# Patient Record
Sex: Male | Born: 1990 | Race: Black or African American | Hispanic: No | Marital: Single | Smoking: Never smoker
Health system: Southern US, Community
[De-identification: ages and names within clinical notes are randomized; demographics above are authoritative.]

## PROBLEM LIST (undated history)

## (undated) DIAGNOSIS — Z789 Other specified health status: Secondary | ICD-10-CM

## (undated) HISTORY — PX: OTHER SURGICAL HISTORY: SHX169

---
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
## 2009-06-14 ENCOUNTER — Emergency Department: Payer: Self-pay | Admitting: Emergency Medicine

## 2013-06-24 ENCOUNTER — Emergency Department: Payer: Self-pay | Admitting: Emergency Medicine

## 2013-06-25 LAB — COMPREHENSIVE METABOLIC PANEL
Albumin: 3.9 g/dL (ref 3.4–5.0)
Anion Gap: 8 (ref 7–16)
BUN: 18 mg/dL (ref 7–18)
Bilirubin,Total: 0.7 mg/dL (ref 0.2–1.0)
Co2: 25 mmol/L (ref 21–32)
Creatinine: 1.62 mg/dL — ABNORMAL HIGH (ref 0.60–1.30)
EGFR (African American): >60
EGFR (Non-African Amer.): 59 — ABNORMAL LOW
Glucose: 94 mg/dL (ref 65–99)
Osmolality: 274 (ref 275–301)
Potassium: 3.7 mmol/L (ref 3.5–5.1)
SGOT(AST): 116 U/L — ABNORMAL HIGH (ref 15–37)
SGPT (ALT): 35 U/L (ref 12–78)
Sodium: 136 mmol/L (ref 136–145)
Total Protein: 7.3 g/dL (ref 6.4–8.2)

## 2013-06-25 LAB — CBC
HGB: 14.4 g/dL (ref 13.0–18.0)
Platelet: 101 10*3/uL — ABNORMAL LOW (ref 150–440)
RBC: 5.2 10*6/uL (ref 4.40–5.90)
WBC: 8.2 10*3/uL (ref 3.8–10.6)

## 2013-06-25 LAB — MONONUCLEOSIS SCREEN: Mono Test: NEGATIVE

## 2013-06-27 LAB — BETA STREP CULTURE(ARMC)

## 2013-08-09 ENCOUNTER — Emergency Department: Payer: Self-pay | Admitting: Emergency Medicine

## 2013-08-11 ENCOUNTER — Observation Stay: Payer: Self-pay | Admitting: Otolaryngology

## 2013-08-11 LAB — COMPREHENSIVE METABOLIC PANEL
Albumin: 3.8 g/dL (ref 3.4–5.0)
Alkaline Phosphatase: 66 U/L (ref 50–136)
Anion Gap: 5 — ABNORMAL LOW (ref 7–16)
BUN: 13 mg/dL (ref 7–18)
Bilirubin,Total: 0.5 mg/dL (ref 0.2–1.0)
Calcium, Total: 9.8 mg/dL (ref 8.5–10.1)
Chloride: 99 mmol/L (ref 98–107)
Co2: 30 mmol/L (ref 21–32)
Creatinine: 1.29 mg/dL (ref 0.60–1.30)
EGFR (Non-African Amer.): >60
Osmolality: 268 (ref 275–301)
Sodium: 134 mmol/L — ABNORMAL LOW (ref 136–145)
Total Protein: 9 g/dL — ABNORMAL HIGH (ref 6.4–8.2)

## 2013-08-11 LAB — BETA STREP CULTURE(ARMC)

## 2013-08-11 LAB — CBC
MCH: 27.1 pg (ref 26.0–34.0)
MCHC: 33 g/dL (ref 32.0–36.0)
Platelet: 160 10*3/uL (ref 150–440)
WBC: 8 10*3/uL (ref 3.8–10.6)

## 2013-08-16 LAB — CULTURE, BLOOD (SINGLE)

## 2014-01-01 ENCOUNTER — Inpatient Hospital Stay: Payer: Self-pay | Admitting: Internal Medicine

## 2014-01-01 LAB — CBC
HCT: 47.9 % (ref 40.0–52.0)
HGB: 15.5 g/dL (ref 13.0–18.0)
MCH: 27.2 pg (ref 26.0–34.0)
MCHC: 32.5 g/dL (ref 32.0–36.0)
MCV: 84 fL (ref 80–100)
PLATELETS: 111 10*3/uL — AB (ref 150–440)
RBC: 5.71 10*6/uL (ref 4.40–5.90)
RDW: 13.7 % (ref 11.5–14.5)
WBC: 5.9 10*3/uL (ref 3.8–10.6)

## 2014-01-01 LAB — COMPREHENSIVE METABOLIC PANEL
ALBUMIN: 4.2 g/dL (ref 3.4–5.0)
ANION GAP: 6 — AB (ref 7–16)
Alkaline Phosphatase: 59 U/L
BUN: 22 mg/dL — AB (ref 7–18)
Bilirubin,Total: 0.5 mg/dL (ref 0.2–1.0)
CO2: 25 mmol/L (ref 21–32)
Calcium, Total: 9.2 mg/dL (ref 8.5–10.1)
Chloride: 103 mmol/L (ref 98–107)
Creatinine: 2.32 mg/dL — ABNORMAL HIGH (ref 0.60–1.30)
EGFR (African American): 44 — ABNORMAL LOW
GFR CALC NON AF AMER: 38 — AB
Glucose: 104 mg/dL — ABNORMAL HIGH (ref 65–99)
OSMOLALITY: 272 (ref 275–301)
Potassium: 3.7 mmol/L (ref 3.5–5.1)
SGOT(AST): 34 U/L (ref 15–37)
SGPT (ALT): 40 U/L (ref 12–78)
SODIUM: 134 mmol/L — AB (ref 136–145)
Total Protein: 8.7 g/dL — ABNORMAL HIGH (ref 6.4–8.2)

## 2014-01-01 LAB — URINALYSIS, COMPLETE
BACTERIA: NONE SEEN
Bilirubin,UR: NEGATIVE
Blood: NEGATIVE
Glucose,UR: NEGATIVE mg/dL (ref 0–75)
Hyaline Cast: 45
LEUKOCYTE ESTERASE: NEGATIVE
Nitrite: NEGATIVE
Ph: 5 (ref 4.5–8.0)
Protein: 100
RBC,UR: 1 /HPF (ref 0–5)
SPECIFIC GRAVITY: 1.034 (ref 1.003–1.030)
Squamous Epithelial: 1

## 2014-01-01 LAB — LIPASE, BLOOD: Lipase: 112 U/L (ref 73–393)

## 2014-01-02 LAB — COMPREHENSIVE METABOLIC PANEL
ALBUMIN: 3.3 g/dL — AB (ref 3.4–5.0)
ALT: 27 U/L (ref 12–78)
ANION GAP: 6 — AB (ref 7–16)
Alkaline Phosphatase: 42 U/L — ABNORMAL LOW
BUN: 22 mg/dL — ABNORMAL HIGH (ref 7–18)
Bilirubin,Total: 0.4 mg/dL (ref 0.2–1.0)
CALCIUM: 7.7 mg/dL — AB (ref 8.5–10.1)
CREATININE: 1.92 mg/dL — AB (ref 0.60–1.30)
Chloride: 107 mmol/L (ref 98–107)
Co2: 24 mmol/L (ref 21–32)
EGFR (Non-African Amer.): 48 — ABNORMAL LOW
GFR CALC AF AMER: 56 — AB
Glucose: 85 mg/dL (ref 65–99)
OSMOLALITY: 276 (ref 275–301)
POTASSIUM: 3.5 mmol/L (ref 3.5–5.1)
SGOT(AST): 22 U/L (ref 15–37)
SODIUM: 137 mmol/L (ref 136–145)
TOTAL PROTEIN: 7 g/dL (ref 6.4–8.2)

## 2014-01-02 LAB — CBC WITH DIFFERENTIAL/PLATELET
BANDS NEUTROPHIL: 19 %
Basophil: 1 %
Comment - H1-Com1: NORMAL
HCT: 40.7 % (ref 40.0–52.0)
HGB: 13.1 g/dL (ref 13.0–18.0)
Lymphocytes: 36 %
MCH: 27.1 pg (ref 26.0–34.0)
MCHC: 32.3 g/dL (ref 32.0–36.0)
MCV: 84 fL (ref 80–100)
MONOS PCT: 16 %
Metamyelocyte: 1 %
Platelet: 91 10*3/uL — ABNORMAL LOW (ref 150–440)
RBC: 4.86 10*6/uL (ref 4.40–5.90)
RDW: 13.5 % (ref 11.5–14.5)
Segmented Neutrophils: 27 %
WBC: 3.2 10*3/uL — AB (ref 3.8–10.6)

## 2014-01-02 LAB — MAGNESIUM: Magnesium: 1.1 mg/dL — ABNORMAL LOW

## 2014-01-02 LAB — WBCS, STOOL

## 2014-01-02 LAB — CLOSTRIDIUM DIFFICILE(ARMC)

## 2014-01-03 LAB — BASIC METABOLIC PANEL
Anion Gap: 8 (ref 7–16)
BUN: 9 mg/dL (ref 7–18)
CO2: 23 mmol/L (ref 21–32)
CREATININE: 1.33 mg/dL — AB (ref 0.60–1.30)
Calcium, Total: 8 mg/dL — ABNORMAL LOW (ref 8.5–10.1)
Chloride: 106 mmol/L (ref 98–107)
EGFR (African American): >60
EGFR (Non-African Amer.): >60
Glucose: 84 mg/dL (ref 65–99)
Osmolality: 272 (ref 275–301)
Potassium: 3.4 mmol/L — ABNORMAL LOW (ref 3.5–5.1)
Sodium: 137 mmol/L (ref 136–145)

## 2014-01-03 LAB — STOOL CULTURE

## 2014-01-03 LAB — MAGNESIUM: MAGNESIUM: 1.9 mg/dL

## 2014-01-04 LAB — BASIC METABOLIC PANEL
ANION GAP: 7 (ref 7–16)
BUN: 7 mg/dL (ref 7–18)
CHLORIDE: 108 mmol/L — AB (ref 98–107)
Calcium, Total: 7.9 mg/dL — ABNORMAL LOW (ref 8.5–10.1)
Co2: 25 mmol/L (ref 21–32)
Creatinine: 1.15 mg/dL (ref 0.60–1.30)
EGFR (African American): >60
EGFR (Non-African Amer.): >60
GLUCOSE: 81 mg/dL (ref 65–99)
Osmolality: 276 (ref 275–301)
Potassium: 3.5 mmol/L (ref 3.5–5.1)
SODIUM: 140 mmol/L (ref 136–145)

## 2014-01-04 LAB — WBC: WBC: 3.1 10*3/uL — ABNORMAL LOW (ref 3.8–10.6)

## 2015-02-07 NOTE — Consult Note (Signed)
PATIENT NAME:  [name], [name]  DATE OF CONSULTATION:  08/11/2013  REFERRING PHYSICIAN:  Dr. Dolores FrameSung CONSULTING PHYSICIAN:  Kyung Ruddreighton C. Ariyonna Twichell, MD  CONSULTING REASON:  Pharyngeal abscess.   HISTORY OF PRESENT ILLNESS:  The patient is a 24 year old male who presents today to [facility] ER for evaluation of worsening sore throat.  He was seen yesterday for the same problem, diagnosed with a viral pharyngitis and was given prednisone and sent home, presents today for a repeat evaluation with worsening symptoms that have not improved with steroids.  He has been having some problems, difficulty with tolerating his own secretions and drooling, was given Cleocin 600 mg and Decadron 10 mg IV here in the Emergency Room with very minimal improvement.  CT scan was performed which revealed a complex fluid collection in his left tonsil as well as some edema of his nasopharynx, pharynx down to the level of his larynx.  The patient continues to report significant pain and difficulty with phonation.  He is able to breathe well.  He has never had anything like this happening to him before.   PAST MEDICAL HISTORY:  Negative.   PAST SURGICAL HISTORY:  No prior surgical procedures on the head and neck.   SOCIAL HISTORY:  [place]  He denies any excessive alcohol or tobacco use.   ALLERGIES:  PENICILLIN AND AMOXICILLIN.   CURRENT MEDICATIONS:  Prednisone.   FAMILY HISTORY:  He has no history of recurrent tonsillitis or easy bleeding.   PHYSICAL EXAMINATION: VITAL SIGNS:  Temperature is 100, pulse is 98, respirations 18, blood pressure 164/89, pulse ox is 98% on room air.    LABORATORY DATA:  White count is 8.0.  CT scan is reviewed which reveals a soft tissue edema of the left pharynx, nasopharynx, tonsil, uvula is a phlegmon of the left tonsil, peritonsillar region and some narrowing of the nasopharynx.  Distal airway is widely patent.    PROCEDURE:  Transnasal flexible laryngoscopy.   PREPROCEDURE DIAGNOSIS:  Possible peritonsillar abscess with edema of the oropharynx.  POSTPROCEDURE DIAGNOSIS:  Possible peritonsillar abscess with edema of the oropharynx.  DESCRIPTION OF PROCEDURE:  Verbal consent was obtained.  The patient's nasal cavity was anesthetized with Genasal and lidocaine and transnasal flexible laryngoscopy was performed.  This demonstrates soft tissue edema and swelling of the patient's left pharynx and nasopharynx and some mild swelling of the hypopharynx and base of tongue, but distal airway vocal folds, epiglottis, supraglottis was widely patent with no significant airway edema.   IMPRESSION:  Peritonsillar phlegmon with difficulty tolerating secretions.   PLAN:  Admit for IV clindamycin as well as IV fluids and continue Decadron and pain control and then we will re-evaluate tomorrow morning.     ____________________________ Kyung Ruddreighton C. Oneka Parada, MD ccv:ea D: 08/11/2013 01:44:37 ET T: 08/11/2013 02:22:04 ET JOB#: [id number]  cc: Kyung Ruddreighton C. Synthia Fairbank, MD, <Dictator> Kyung RuddREIGHTON C Daray Polgar MD ELECTRONICALLY SIGNED 08/31/2013 7:17

## 2015-02-08 NOTE — H&P (Signed)
PATIENT NAME:  [name], [name] MR#:  [id number] DATE OF BIRTH:  09-20-1991  DATE OF ADMISSION:  01/01/2014  ADMITTING PHYSICIAN: Gladstone Lighter, MD  PRIMARY CARE PHYSICIAN: None.   CHIEF COMPLAINT: Nausea, vomiting and diarrhea.   HISTORY OF PRESENT ILLNESS: [name] is a 24 year old African American male with no significant past medical history who presents to the hospital secondary to nausea, vomiting and diarrhea for 4 days now. The patient states he has a 50-year-old son who was sick with diarrhea, nausea and vomiting 1 week ago. The patient started to feel bad for the last 4 days. It started with watery diarrhea. Whatever he was eating he was having a bowel movement immediately after that. He denied any fevers, initially. No abdominal pain. Since last night he has been feeling hot and felt like he had a fever, very sick with body aches and this morning he woke up with intense nausea and vomiting. He has not been eating or drinking well over the last 4 days and presented to the ER. He seems extremely dehydrated on exam and his renal function worsened to 2.3 of creatinine whereas his baseline is almost 1.2. So he is being admitted for systemic antiinflammatory response syndrome with high fever of 103 degrees Fahrenheit, tachycardia, and likely from viral gastroenteritis.   PAST MEDICAL HISTORY: None.   PAST SURGICAL HISTORY: None.   ALLERGIES TO MEDICATIONS: PENICILLIN.   CURRENT HOME MEDICATIONS: None.   SOCIAL HISTORY: Living at home with his mother. He also has a 46-year-old son. He is currently unemployed. He smokes a cigar once in a while and denies any alcohol use.   FAMILY HISTORY: Mom with diabetes. Does not know about his dad.  REVIEW OF SYSTEMS:  CONSTITUTIONAL: Positive for fatigue, fever and weakness.  EYES: No blurred vision, double vision, inflammation or glaucoma.  ENT: No tinnitus, ear pain, hearing loss, epistaxis or discharge.  RESPIRATORY: No cough, wheeze, hemoptysis  or COPD. CARDIOVASCULAR: No chest pain, orthopnea, edema, arrhythmia, palpitations, or syncope.  GASTROINTESTINAL: Positive for nausea, vomiting, and diarrhea. No abdominal pain. No hematemesis or melena.  GENITOURINARY: Decreased urination but no dysuria, hematuria, renal calculus or frequency.  ENDOCRINE: No polyuria, nocturia, thyroid problems, heat or cold intolerance.  HEMATOLOGY: No anemia, easy bruising or bleeding.  SKIN: No acne, rash or lesions.  MUSCULOSKELETAL: No neck, back, shoulder pain, arthritis or gout.  NEUROLOGIC: No numbness, weakness, CVA, TIA or seizures.  PSYCHOLOGICAL: No anxiety, insomnia or depression.   PHYSICAL EXAMINATION: VITAL SIGNS: Temperature 103.1 degrees Fahrenheit, pulse 116, respirations 20, blood pressure 116/54, pulse ox 98% on room air.  GENERAL: Heavily built, well-nourished male lying in bed, not in any acute distress.  HEENT: Normocephalic, atraumatic. Pupils equal, round, and reacting to light. Anicteric sclerae. Extraocular movements intact. Oropharynx clear without erythema, mass or exudates.  NECK: Supple. No thyromegaly, JVD or carotid bruits. No lymphadenopathy.  LUNGS: Moving air bilaterally. No wheeze or crackles. No use of accessory muscles for breathing.  HEART: S1 and S2 regular rate and rhythm. No murmurs, rubs, or gallops.  ABDOMEN: Soft, nontender, nondistended. No hepatosplenomegaly. Normal bowel sounds.  EXTREMITIES: No pedal edema. No clubbing or cyanosis. 2+ dorsalis pedis pulses palpable bilaterally.  SKIN: No acne, rash or lesions. LYMPH: No cervical or inguinal lymphadenopathy.  NEUROLOGIC: Cranial nerves intact. No focal motor or sensory deficits.  PSYCHOLOGIC: The patient is awake, alert, and oriented x3.   DIAGNOSTIC DATA: Laboratory: WBC 5.9, hemoglobin 15.5, hematocrit 47.9, platelet count 111.  Sodium 134, potassium 3.7, chloride 103, bicarb 25, BUN 22, creatinine 2.32, glucose 104 and calcium 9.2.   ALT 40, AST 34,  alk phos 59, total bili 0.5, albumin 4.2. Lipase is 112.   Chest x-ray is showing no acute intracranial abnormalities.   ASSESSMENT AND PLAN: This is a young 24 year old male with no significant past medical history who comes with nausea, vomiting and diarrhea. Son was sick with similar symptoms last week.  1.  Systemic antiinflammatory response syndrome likely secondary to viral gastroenteritis at this point. Will admit for IV fluids. Stool studies and antiemetics have been ordered. Will hold off on antibiotics at this time as it seems more viral in nature. 2.  Acute renal failure, likely prerenal from dehydration. Continue IV fluids and monitor. If no improvement, renal ultrasound will be ordered tomorrow.   CODE STATUS: FULL.  TIME SPENT ON ADMISSION: 50 minutes.   ____________________________ Gladstone Lighter, MD rk:sb D: 01/01/2014 14:19:08 ET T: 01/01/2014 14:50:14 ET JOB#: [id number]  cc: Gladstone Lighter, MD, <Dictator> Gladstone Lighter MD ELECTRONICALLY SIGNED 01/10/2014 16:04

## 2015-02-08 NOTE — Discharge Summary (Signed)
PATIENT NAME:  [name], [name] MR#:  [id number] DATE OF BIRTH:  [birth year]  DATE OF ADMISSION:  01/01/2014 DATE OF DISCHARGE:  01/04/2014  ADMITTING PHYSICIAN: Enid Baasadhika Seymore Brodowski, MD  DISCHARGING PHYSICIAN: Enid Baasadhika Magon Croson, MD  PRIMARY CARE PHYSICIAN: None.   CONSULTATIONS IN HOSPITAL: None.   DISCHARGE DIAGNOSES: 1.  Systemic inflammatory response syndrome.  2.  Clostridium difficile colitis.  3.  Acute renal failure, which resolved. 4.  Hypomagnesemia.   DISCHARGE HOME MEDICATIONS:  1.  Flagyl 500 mg p.o. t.i.d. for 10 days.  2.  Bentyl 10 mg q.8 hours p.r.n. for abdominal cramping.   DISCHARGE DIET: Regular diet.   DISCHARGE ACTIVITY: As tolerated.    FOLLOWUP INSTRUCTIONS: If symptoms worsen, come back to urgent care, as patient has no PCP.   LABORATORY AND IMAGING STUDIES PRIOR TO DISCHARGE: Sodium 140, potassium 3.5, chloride 108, bicarbonate 25, BUN 7, creatinine 1.15, glucose 81, and calcium of 7.9. WBC 3.1; hemoglobin is 13.1, hematocrit 40.7, platelet count 91. Magnesium is 1.9. ALT is 27, AST 22, alkaline phosphatase 42, total bilirubin 0.4, and albumin of 3.3. Stool cultures are negative and WBCs are negative in stool, but stool for C. difficile antigen and toxin are positive. Chest x-ray on admission showing clear lung fields. No acute cardiopulmonary abnormality. Urinalysis negative for any infection.   BRIEF HOSPITAL COURSE: [name] is a young 24 year old African American male with no significant past medical history, presents with nausea, vomiting, and diarrhea after being exposed to his son who has had similar symptoms a week ago. The patient also had significant high-grade fevers and leukocytosis on admission.  1.  Systemic inflammatory response syndrome secondary to C. difficile colitis, initially thought to be viral. However, stool for C. difficile was sent and it came back positive. The patient has been started on Flagyl, and for the first couple of days his  symptoms did not improve, but on the day of discharge the patient has been doing well. No further nausea or vomiting. Denies abdominal cramping and is able to tolerate a regular diet with improved diarrhea. His labs have improved.  2.  He had acute renal failure on admission, which resolved. 3.  Hypomagnesemia, which was replaced.  4.  His course has been otherwise uneventful in the hospital.   DISCHARGE CONDITION: Stable.   DISCHARGE DISPOSITION: Home.   TIME SPENT ON DISCHARGE: 40 minutes.  ____________________________ Enid Baasadhika Abimbola Aki, MD rk:jcm D: 01/04/2014 15:18:46 ET T: 01/04/2014 22:27:25 ET JOB#: [id number]  cc: Enid Baasadhika Abem Shaddix, MD, <Dictator> Enid BaasADHIKA Francisco Wanzer MD ELECTRONICALLY SIGNED 01/10/2014 16:06

## 2015-10-29 ENCOUNTER — Emergency Department
Admission: EM | Admit: 2015-10-29 | Discharge: 2015-10-29 | Disposition: A | Discharge status: Home / Self Care | Payer: Self-pay | Attending: Emergency Medicine | Admitting: Emergency Medicine

## 2015-10-29 ENCOUNTER — Encounter: Payer: Self-pay | Admitting: Emergency Medicine

## 2015-10-29 ENCOUNTER — Emergency Department: Payer: Self-pay

## 2015-10-29 DIAGNOSIS — Z880 Allergy status to penicillin: Secondary | ICD-10-CM | POA: Insufficient documentation

## 2015-10-29 DIAGNOSIS — J209 Acute bronchitis, unspecified: Secondary | ICD-10-CM | POA: Insufficient documentation

## 2015-10-29 DIAGNOSIS — J40 Bronchitis, not specified as acute or chronic: Secondary | ICD-10-CM

## 2015-10-29 HISTORY — DX: Other specified health status: Z78.9

## 2015-10-29 MED ORDER — IBUPROFEN 800 MG PO TABS: 800.0000 mg | ORAL_TABLET | Freq: Three times a day (TID) | ORAL | Status: AC | PRN

## 2015-10-29 MED ORDER — PSEUDOEPH-BROMPHEN-DM 30-2-10 MG/5ML PO SYRP: 5.0000 mL | ORAL_SOLUTION | Freq: Four times a day (QID) | ORAL | Status: AC | PRN

## 2015-10-29 MED ORDER — AZITHROMYCIN 500 MG PO TABS: 500.0000 mg | ORAL_TABLET | Freq: Every day | ORAL | Status: DC

## 2015-10-29 NOTE — ED Notes (Signed)
Cough, congestion, fever and body aches since Monday

## 2015-10-29 NOTE — ED Notes (Signed)
Pt c/o flu like S/S.  Pt denies n/v.  Pt sts he has no appetite

## 2015-10-29 NOTE — ED Provider Notes (Signed)
[facility] Emergency Department Provider Note  ____________________________________________  Time seen: Approximately 12:15 PM  I have reviewed the triage vital signs and the nursing notes.   HISTORY  Chief Complaint Cough and Fever    HPI [name] is a 25 y.o. male patient state fever or cough and body aches for 3 days. Patient denies any nausea vomiting diarrhea. Patient stated there is decreased appetite.No palliative measures taken this complaint. Patient rates his pain discomfort as a 6/10. Patient describes pain as "achy".   Past Medical History  Diagnosis Date  . Patient denies medical problems     There are no active problems to display for this patient.   No past surgical history on file.  Current Outpatient Rx  Name  Route  Sig  Dispense  Refill  . azithromycin (ZITHROMAX) 500 MG tablet   Oral   Take 1 tablet (500 mg total) by mouth daily. Take 1 tablet daily for 3 days.   3 tablet   0   . brompheniramine-pseudoephedrine-DM 30-2-10 MG/5ML syrup   Oral   Take 5 mLs by mouth 4 (four) times daily as needed.   120 mL   0   . ibuprofen (ADVIL,MOTRIN) 800 MG tablet   Oral   Take 1 tablet (800 mg total) by mouth every 8 (eight) hours as needed.   30 tablet   0     Allergies Penicillins  No family history on file.  Social History Social History  Substance Use Topics  . Smoking status: Never Smoker   . Smokeless tobacco: None  . Alcohol Use: No    Review of Systems Constitutional: No fever/chills Eyes: No visual changes. ENT: No sore throat. Cardiovascular: Denies chest pain. Respiratory: Shortness of breath and productive cough.. Gastrointestinal: No abdominal pain.  No nausea, no vomiting.  No diarrhea.  No constipation. Genitourinary: Negative for dysuria. Musculoskeletal: Negative for back pain. Skin: Negative for rash. Neurological: Negative for headaches, focal weakness or numbness. 10-point ROS otherwise  negative.  ____________________________________________   PHYSICAL EXAM:  VITAL SIGNS: ED Triage Vitals  Enc Vitals Group     BP 10/29/15 1124 161/93 mmHg     Pulse Rate 10/29/15 1124 92     Resp 10/29/15 1124 18     Temp 10/29/15 1124 98.5 F (36.9 C)     Temp Source 10/29/15 1124 Oral     SpO2 10/29/15 1124 100 %     Weight 10/29/15 1124 270 lb (122.471 kg)     Height 10/29/15 1124 6' (1.829 m)     Head Cir --      Peak Flow --      Pain Score 10/29/15 1126 6     Pain Loc --      Pain Edu? --      Excl. in GC? --     Constitutional: Alert and oriented. Well appearing and in no acute distress. Eyes: Conjunctivae are normal. PERRL. EOMI. Head: Atraumatic. Nose: No congestion/rhinnorhea. Mouth/Throat: Mucous membranes are moist.  Oropharynx non-erythematous. Neck: No stridor.  No cervical spine tenderness to palpation. Hematological/Lymphatic/Immunilogical: No cervical lymphadenopathy. Cardiovascular: Normal rate, regular rhythm. Grossly normal heart sounds.  Good peripheral circulation. Blood pressure Respiratory: Normal respiratory effort.  No retractions. Lungs with diffuse rhonchi sounds Gastrointestinal: Soft and nontender. No distention. No abdominal bruits. No CVA tenderness. Musculoskeletal: No lower extremity tenderness nor edema.  No joint effusions. Neurologic:  Normal speech and language. No gross focal neurologic deficits are appreciated. No gait  instability. Skin:  Skin is warm, dry and intact. No rash noted. Psychiatric: Mood and affect are normal. Speech and behavior are normal.  ____________________________________________   LABS (all labs ordered are listed, but only abnormal results are displayed)  Labs Reviewed - No data to display ____________________________________________  EKG   ____________________________________________  RADIOLOGY  Findings consistent with acute bronchitis. I, Joni Reiningonald K Katryna Tschirhart, personally viewed and evaluated these  images (plain radiographs) as part of my medical decision making, as well as reviewing the written report by the radiologist.  ____________________________________________   PROCEDURES  Procedure(s) performed: None  Critical Care performed: No  ____________________________________________   INITIAL IMPRESSION / ASSESSMENT AND PLAN / ED COURSE  Pertinent labs & imaging results that were available during my care of the patient were reviewed by me and considered in my medical decision making (see chart for details).  Bronchitis. Patient a prescription for Zithromax and Bromfed-DM. She given a work note for 2 days. ____________________________________________   FINAL CLINICAL IMPRESSION(S) / ED DIAGNOSES  Final diagnoses:  Bronchitis      Joni ReiningRonald K Latisa Belay, PA-C 10/29/15 1313  [name] Matthew Schaevitz, MD 10/29/15 [phone]

## 2015-10-29 NOTE — Discharge Instructions (Signed)

## 2016-01-29 ENCOUNTER — Emergency Department
Admission: EM | Admit: 2016-01-29 | Discharge: 2016-01-29 | Disposition: A | Discharge status: Home / Self Care | Payer: Self-pay | Attending: Emergency Medicine | Admitting: Emergency Medicine

## 2016-01-29 DIAGNOSIS — B9789 Other viral agents as the cause of diseases classified elsewhere: Secondary | ICD-10-CM | POA: Insufficient documentation

## 2016-01-29 DIAGNOSIS — J028 Acute pharyngitis due to other specified organisms: Secondary | ICD-10-CM | POA: Insufficient documentation

## 2016-01-29 DIAGNOSIS — J029 Acute pharyngitis, unspecified: Secondary | ICD-10-CM

## 2016-01-29 LAB — POCT RAPID STREP A: Streptococcus, Group A Screen (Direct): NEGATIVE

## 2016-01-29 MED ORDER — PROMETHAZINE-DM 6.25-15 MG/5ML PO SYRP: 5.0000 mL | ORAL_SOLUTION | Freq: Four times a day (QID) | ORAL | Status: AC | PRN

## 2016-01-29 MED ORDER — LIDOCAINE VISCOUS 2 % MT SOLN: 5.0000 mL | Freq: Four times a day (QID) | OROMUCOSAL | Status: AC | PRN

## 2016-01-29 MED ORDER — IBUPROFEN 100 MG/5ML PO SUSP
ORAL | Status: AC
  Filled 2016-01-29: qty 40

## 2016-01-29 MED ORDER — IBUPROFEN 100 MG/5ML PO SUSP
800.0000 mg | Freq: Once | ORAL | Status: AC
  Administered 2016-01-29: 800 mg via ORAL

## 2016-01-29 MED ORDER — PREDNISONE 20 MG PO TABS
60.0000 mg | ORAL_TABLET | Freq: Once | ORAL | Status: AC
  Administered 2016-01-29: 60 mg via ORAL
  Filled 2016-01-29: qty 3

## 2016-01-29 MED ORDER — DIPHENHYDRAMINE HCL 12.5 MG/5ML PO ELIX
25.0000 mg | ORAL_SOLUTION | Freq: Once | ORAL | Status: AC
  Administered 2016-01-29: 25 mg via ORAL
  Filled 2016-01-29: qty 10

## 2016-01-29 MED ORDER — METHYLPREDNISOLONE 4 MG PO TBPK: ORAL_TABLET | ORAL | Status: DC

## 2016-01-29 MED ORDER — LIDOCAINE VISCOUS 2 % MT SOLN
15.0000 mL | Freq: Once | OROMUCOSAL | Status: AC
  Administered 2016-01-29: 15 mL via OROMUCOSAL
  Filled 2016-01-29: qty 15

## 2016-01-29 NOTE — ED Provider Notes (Signed)
[facility] Emergency Department Provider Note  ____________________________________________  Time seen: Approximately 6:08 PM  I have reviewed the triage vital signs and the nursing notes.   HISTORY  Chief Complaint Sore Throat    HPI ATHEL MERRIWEATHER is a 25 y.o. male patient complaining of sore throat for 2 days. Patient stated most pain is on the left side of his stroke. Patient states unable to tolerate solid foods but can tolerate fluids. Patient states fever associated with this complaint.Patient's using over-the-counter mouthwash gargle and a cough preparation with no effect. Patient rates his pain discomfort as a 8/10. Describes pain as sharp.   Past Medical History  Diagnosis Date  . Patient denies medical problems     There are no active problems to display for this patient.   History reviewed. No pertinent past surgical history.  Current Outpatient Rx  Name  Route  Sig  Dispense  Refill  . azithromycin (ZITHROMAX) 500 MG tablet   Oral   Take 1 tablet (500 mg total) by mouth daily. Take 1 tablet daily for 3 days.   3 tablet   0   . brompheniramine-pseudoephedrine-DM 30-2-10 MG/5ML syrup   Oral   Take 5 mLs by mouth 4 (four) times daily as needed.   120 mL   0   . ibuprofen (ADVIL,MOTRIN) 800 MG tablet   Oral   Take 1 tablet (800 mg total) by mouth every 8 (eight) hours as needed.   30 tablet   0   . lidocaine (XYLOCAINE) 2 % solution   Mouth/Throat   Use as directed 5 mLs in the mouth or throat every 6 (six) hours as needed for mouth pain. Mixed with 5 ML's of Phenergan DM with swish and swallow.   100 mL   0   . methylPREDNISolone (MEDROL DOSEPAK) 4 MG TBPK tablet      Take Tapered dose as directed   21 tablet   0   . promethazine-dextromethorphan (PROMETHAZINE-DM) 6.25-15 MG/5ML syrup   Oral   Take 5 mLs by mouth 4 (four) times daily as needed for cough. Mixed with 5 ML's of viscous lidocaine for swish and swallow.   118  mL   0     Allergies Penicillins  No family history on file.  Social History Social History  Substance Use Topics  . Smoking status: Never Smoker   . Smokeless tobacco: None  . Alcohol Use: No    Review of Systems Constitutional: Fever Eyes: No visual changes. ENT: Sore throat Cardiovascular: Denies chest pain. Respiratory: Denies shortness of breath. Gastrointestinal: No abdominal pain.  No nausea, no vomiting.  No diarrhea.  No constipation. Genitourinary: Negative for dysuria. Musculoskeletal: Negative for back pain. Skin: Negative for rash. Neurological: Negative for headaches, focal weakness or numbness. Allergic/Immunilogical: Penicillin  10-point ROS otherwise negative.  ____________________________________________   PHYSICAL EXAM:  VITAL SIGNS: ED Triage Vitals  Enc Vitals Group     BP 01/29/16 1736 169/94 mmHg     Pulse Rate 01/29/16 1736 80     Resp 01/29/16 1736 18     Temp 01/29/16 1736 101.1 F (38.4 C)     Temp Source 01/29/16 1736 Oral     SpO2 01/29/16 1736 96 %     Weight 01/29/16 1736 262 lb (118.842 kg)     Height 01/29/16 1736  (1.93 m)     Head Cir --      Peak Flow --      Pain  Score 01/29/16 1736 8     Pain Loc --      Pain Edu? --      Excl. in GC? --     Constitutional: Alert and oriented. Well appearing and in no acute distress. Eyes: Conjunctivae are normal. PERRL. EOMI. Head: Atraumatic. Nose: No congestion/rhinnorhea. Mouth/Throat: Mucous membranes are moist.  Oropharynx erythematous. Nonexudative tonsils Neck: No stridor.  No cervical spine tenderness to palpation. Hematological/Lymphatic/Immunilogical: No cervical lymphadenopathy. Cardiovascular: Normal rate, regular rhythm. Grossly normal heart sounds.  Good peripheral circulation. Respiratory: Normal respiratory effort.  No retractions. Lungs CTAB. Gastrointestinal: Soft and nontender. No distention. No abdominal bruits. No CVA tenderness. Musculoskeletal: No  lower extremity tenderness nor edema.  No joint effusions. Neurologic:  Normal speech and language. No gross focal neurologic deficits are appreciated. No gait instability. Skin:  Skin is warm, dry and intact. No rash noted. Psychiatric: Mood and affect are normal. Speech and behavior are normal.  ____________________________________________   LABS (all labs ordered are listed, but only abnormal results are displayed)  Labs Reviewed  CULTURE, GROUP A STREP [facility]  POCT RAPID STREP A   ____________________________________________  EKG   ____________________________________________  RADIOLOGY   ____________________________________________   PROCEDURES  Procedure(s) performed: None  Critical Care performed: No  ____________________________________________   INITIAL IMPRESSION / ASSESSMENT AND PLAN / ED COURSE  Pertinent labs & imaging results that were available during my care of the patient were reviewed by me and considered in my medical decision making (see chart for details).  Rapid strep was negative. Patient given discharge Instruction for viral pharyngitis. Patient given a prescription for Medrol Dosepak, viscous lidocaine, and Bromfed-DM. Patient given a work note for today. Advised to follow-up with "clinic if condition persists. ____________________________________________   FINAL CLINICAL IMPRESSION(S) / ED DIAGNOSES  Final diagnoses:  Acute viral pharyngitis      Joni ReiningRonald K Phyllis Abelson, PA-C 01/29/16 1817  Sharyn CreamerMark Quale, MD 01/30/16 1546

## 2016-01-29 NOTE — ED Notes (Signed)
Pt c/o sore throat since Tuesday.. Stats all on the left side and he has not been able to eat anything,

## 2016-01-29 NOTE — Discharge Instructions (Signed)

## 2016-01-31 ENCOUNTER — Emergency Department: Payer: Self-pay

## 2016-01-31 ENCOUNTER — Encounter: Payer: Self-pay | Admitting: Emergency Medicine

## 2016-01-31 ENCOUNTER — Inpatient Hospital Stay
Admission: EM | Admit: 2016-01-31 | Discharge: 2016-02-02 | DRG: 153 | Disposition: A | Discharge status: Home / Self Care | Payer: Self-pay | Attending: Internal Medicine | Admitting: Internal Medicine

## 2016-01-31 DIAGNOSIS — Z79899 Other long term (current) drug therapy: Secondary | ICD-10-CM

## 2016-01-31 DIAGNOSIS — J390 Retropharyngeal and parapharyngeal abscess: Secondary | ICD-10-CM | POA: Diagnosis present

## 2016-01-31 DIAGNOSIS — Z8249 Family history of ischemic heart disease and other diseases of the circulatory system: Secondary | ICD-10-CM

## 2016-01-31 DIAGNOSIS — E876 Hypokalemia: Secondary | ICD-10-CM | POA: Diagnosis present

## 2016-01-31 DIAGNOSIS — J36 Peritonsillar abscess: Principal | ICD-10-CM | POA: Diagnosis present

## 2016-01-31 DIAGNOSIS — Z880 Allergy status to penicillin: Secondary | ICD-10-CM

## 2016-01-31 DIAGNOSIS — R1319 Other dysphagia: Secondary | ICD-10-CM | POA: Diagnosis present

## 2016-01-31 LAB — COMPREHENSIVE METABOLIC PANEL
ALK PHOS: 52 U/L (ref 38–126)
ALT: 46 U/L (ref 17–63)
AST: 36 U/L (ref 15–41)
Albumin: 4.4 g/dL (ref 3.5–5.0)
Anion gap: 9 (ref 5–15)
BUN: 14 mg/dL (ref 6–20)
CALCIUM: 9.3 mg/dL (ref 8.9–10.3)
CHLORIDE: 100 mmol/L — AB (ref 101–111)
CO2: 26 mmol/L (ref 22–32)
CREATININE: 1.33 mg/dL — AB (ref 0.61–1.24)
Glucose, Bld: 101 mg/dL — ABNORMAL HIGH (ref 65–99)
Potassium: 3.3 mmol/L — ABNORMAL LOW (ref 3.5–5.1)
Sodium: 135 mmol/L (ref 135–145)
Total Bilirubin: 1.5 mg/dL — ABNORMAL HIGH (ref 0.3–1.2)
Total Protein: 8.8 g/dL — ABNORMAL HIGH (ref 6.5–8.1)

## 2016-01-31 LAB — CBC WITH DIFFERENTIAL/PLATELET
BASOS ABS: 0 10*3/uL (ref 0–0.1)
Basophils Relative: 0 %
Eosinophils Absolute: 0 10*3/uL (ref 0–0.7)
Eosinophils Relative: 0 %
HEMATOCRIT: 43.9 % (ref 40.0–52.0)
HEMOGLOBIN: 14.6 g/dL (ref 13.0–18.0)
LYMPHS ABS: 1.2 10*3/uL (ref 1.0–3.6)
LYMPHS PCT: 11 %
MCH: 27.7 pg (ref 26.0–34.0)
MCHC: 33.2 g/dL (ref 32.0–36.0)
MCV: 83.5 fL (ref 80.0–100.0)
Monocytes Absolute: 1.3 10*3/uL — ABNORMAL HIGH (ref 0.2–1.0)
Monocytes Relative: 12 %
NEUTROS ABS: 8.1 10*3/uL — AB (ref 1.4–6.5)
NEUTROS PCT: 77 %
PLATELETS: 137 10*3/uL — AB (ref 150–440)
RBC: 5.26 MIL/uL (ref 4.40–5.90)
RDW: 13.7 % (ref 11.5–14.5)
WBC: 10.7 10*3/uL — AB (ref 3.8–10.6)

## 2016-01-31 LAB — MONONUCLEOSIS SCREEN: MONO SCREEN: NEGATIVE

## 2016-01-31 LAB — CULTURE, GROUP A STREP (THRC)

## 2016-01-31 MED ORDER — METHYLPREDNISOLONE SODIUM SUCC 125 MG IJ SOLR
125.0000 mg | Freq: Once | INTRAMUSCULAR | Status: AC
  Administered 2016-01-31: 125 mg via INTRAVENOUS
  Filled 2016-01-31: qty 2

## 2016-01-31 MED ORDER — BENZOCAINE 20 % MT SOLN
OROMUCOSAL | Status: AC
  Administered 2016-02-01
  Filled 2016-01-31: qty 5

## 2016-01-31 MED ORDER — ONDANSETRON HCL 4 MG/2ML IJ SOLN
4.0000 mg | Freq: Once | INTRAMUSCULAR | Status: AC
  Administered 2016-01-31: 4 mg via INTRAVENOUS
  Filled 2016-01-31: qty 2

## 2016-01-31 MED ORDER — DEXTROSE 5 % IV SOLN
1.0000 g | Freq: Once | INTRAVENOUS | Status: AC
  Administered 2016-01-31: 1 g via INTRAVENOUS
  Filled 2016-01-31: qty 10

## 2016-01-31 MED ORDER — SODIUM CHLORIDE 0.9 % IV BOLUS (SEPSIS)
1000.0000 mL | Freq: Once | INTRAVENOUS | Status: AC
  Administered 2016-01-31: 1000 mL via INTRAVENOUS

## 2016-01-31 MED ORDER — MORPHINE SULFATE (PF) 4 MG/ML IV SOLN
4.0000 mg | Freq: Once | INTRAVENOUS | Status: AC
  Administered 2016-01-31: 4 mg via INTRAVENOUS
  Filled 2016-01-31: qty 1

## 2016-01-31 MED ORDER — CLINDAMYCIN PHOSPHATE 600 MG/50ML IV SOLN
600.0000 mg | Freq: Once | INTRAVENOUS | Status: AC
  Administered 2016-01-31: 600 mg via INTRAVENOUS
  Filled 2016-01-31: qty 50

## 2016-01-31 MED ORDER — IOPAMIDOL (ISOVUE-300) INJECTION 61%
75.0000 mL | Freq: Once | INTRAVENOUS | Status: AC | PRN
  Administered 2016-01-31: 75 mL via INTRAVENOUS
  Filled 2016-01-31: qty 75

## 2016-01-31 NOTE — ED Provider Notes (Signed)
Medical screening examination/treatment/procedure(s) were conducted as a shared visit with non-physician practitioner(s) and myself.  I personally evaluated the patient during the encounter.     CSN: [id number]     Arrival date & time 01/31/16  1758 History   First MD Initiated Contact with Patient 01/31/16 1908     Chief Complaint  Patient presents with  . Sore Throat     HPI  25 year old male who presents to the emergency department for evaluation of sore throat. He states that the symptoms started approximately 5 days ago. He was evaluated here 2 days ago given a prescription for azithromycin, Bromfed, ibuprofen, and lidocaine. He has had no relief of his symptoms and feels that he is worse. He is having difficulty swallowing. He is unable to eat or drink anything at this time.  Past Medical History  Diagnosis Date  . Patient denies medical problems    History reviewed. No pertinent past surgical history. No family history on file. Social History  Substance Use Topics  . Smoking status: Never Smoker   . Smokeless tobacco: None  . Alcohol Use: No    Review of Systems  Constitutional: Positive for fever, chills, activity change and appetite change.  HENT: Positive for sore throat, trouble swallowing and voice change. Negative for drooling and facial swelling.   Respiratory: Negative for cough, choking and stridor.   Gastrointestinal: Negative for vomiting and diarrhea.  Musculoskeletal: Positive for myalgias. Negative for arthralgias and neck stiffness.  Neurological: Positive for headaches.      Allergies  Penicillins  Home Medications   Prior to Admission medications   Medication Sig Start Date End Date Taking? Authorizing Provider  azithromycin (ZITHROMAX) 500 MG tablet Take 1 tablet (500 mg total) by mouth daily. Take 1 tablet daily for 3 days. 10/29/15   Joni Reining, PA-C  brompheniramine-pseudoephedrine-DM 30-2-10 MG/5ML syrup Take 5 mLs by mouth 4 (four)  times daily as needed. 10/29/15   Joni Reining, PA-C  ibuprofen (ADVIL,MOTRIN) 800 MG tablet Take 1 tablet (800 mg total) by mouth every 8 (eight) hours as needed. 10/29/15   Joni Reining, PA-C  lidocaine (XYLOCAINE) 2 % solution Use as directed 5 mLs in the mouth or throat every 6 (six) hours as needed for mouth pain. Mixed with 5 ML's of Phenergan DM with swish and swallow. 01/29/16   Joni Reining, PA-C  methylPREDNISolone (MEDROL DOSEPAK) 4 MG TBPK tablet Take Tapered dose as directed 01/29/16   Joni Reining, PA-C  promethazine-dextromethorphan (PROMETHAZINE-DM) 6.25-15 MG/5ML syrup Take 5 mLs by mouth 4 (four) times daily as needed for cough. Mixed with 5 ML's of viscous lidocaine for swish and swallow. 01/29/16   Joni Reining, PA-C   BP 168/81 mmHg  Pulse 87  Temp(Src) 99.9 F (37.7 C) (Oral)  Resp 20  Ht  (1.93 m)  Wt 118.842 kg  BMI 31.90 kg/m2  SpO2 98% Physical Exam  Constitutional: He is oriented to person, place, and time. He appears well-developed and well-nourished.  HENT:  Mouth/Throat: Mucous membranes are normal. There is trismus in the jaw. Posterior oropharyngeal edema, posterior oropharyngeal erythema and tonsillar abscesses present.    Neck: Normal range of motion. Neck supple.  Pulmonary/Chest: Effort normal and breath sounds normal.  Musculoskeletal: Normal range of motion.  Lymphadenopathy:    He has cervical adenopathy.  Neurological: He is alert and oriented to person, place, and time.  Skin: Skin is warm and dry.  Psychiatric: He has a normal  mood and affect. His behavior is normal.  Nursing note and vitals reviewed.   ED Course  Procedures (including critical care time) Labs Review Labs Reviewed  CBC WITH DIFFERENTIAL/PLATELET - Abnormal; Notable for the following:    WBC 10.7 (*)    Platelets 137 (*)    Neutro Abs 8.1 (*)    Monocytes Absolute 1.3 (*)    All other components within normal limits  COMPREHENSIVE METABOLIC PANEL - Abnormal;  Notable for the following:    Potassium 3.3 (*)    Chloride 100 (*)    Glucose, Bld 101 (*)    Creatinine, Ser 1.33 (*)    Total Protein 8.8 (*)    Total Bilirubin 1.5 (*)    All other components within normal limits  CULTURE, BLOOD (ROUTINE X 2)  CULTURE, BLOOD (ROUTINE X 2)  MONONUCLEOSIS SCREEN    Imaging Review Ct Soft Tissue Neck W Contrast  01/31/2016  CLINICAL DATA:  Initial evaluation for acute sore throat. EXAM: CT NECK WITH CONTRAST TECHNIQUE: Multidetector CT imaging of the neck was performed using the standard protocol following the bolus administration of intravenous contrast. CONTRAST:  75mL ISOVUE-300 IOPAMIDOL (ISOVUE-300) INJECTION 61% COMPARISON:  Prior study from 08/11/2013. FINDINGS: Visualized portions of the brain are unremarkable. Visualized paranasal sinuses are clear. Visualized mastoid air cells are well pneumatized. Middle ear cavities are clear. Salivary glands including the parotid glands and submandibular glands are within normal limits. Oral cavity itself is normal. No acute abnormality about the dentition. Asymmetric swelling and edema within the left palatine tonsil. There is a in hypodense rim enhancing collection just lateral and superior to the left tonsil that measures 1.7 x 2.2 x 3.0 cm, consistent with tonsillar/ peritonsillar abscess. The swelling within the adjacent left oropharyngeal mucosa as well as the uvula. Inflammatory stranding within the left parapharyngeal fat. Mild inflammatory stranding extends inferiorly and anteriorly into the left submandibular space as well. Associated retropharyngeal effusion without frank abscess. Layering secretions within the hypopharynx at the level of the piriform sinuses. Swelling extends within the pharyngeal mucosa down to the left piriform sinus. Oropharyngeal airway is somewhat narrowed but remains patent. Epiglottis itself is normal. Vallecula is clear. Aryepiglottic folds without significant edema. True cords  normal. Subglottic airway clear. Enlarged bilateral level 2 lymph nodes measure up to 16 mm on the left and 13 mm on the right, likely reactive. No other significant adenopathy within the neck. Thyroid gland is normal. Visualized superior mediastinum within normal limits. Visualized lungs are clear. Normal intravascular enhancement seen throughout the neck. No acute osseous abnormality. No worrisome lytic or blastic osseous lesions. IMPRESSION: 1. 1.7 x 2.2 x 3.0 cm left tonsillar/peritonsillar abscess. There is associated swelling and edema within the left oropharynx/ pharynx with inflammatory stranding within the left parapharyngeal and submandibular spaces. Retropharyngeal effusion without frank abscess. Oropharyngeal airway narrowed but remains patent at this time. 2. Bilateral level II adenopathy, likely reactive. Electronically Signed   By: Rise MuBenjamin  McClintock M.D.   On: 01/31/2016 22:03   I have personally reviewed and evaluated these images and lab results as part of my medical decision-making.   EKG Interpretation None      MDM   Final diagnoses:  None   Patient appears ill. Labs, IV fluids, SoluMedrol 125mg , Rocephin1g, Morphine 4mg , and ondansetron given. Will get CT with contrast. Patient tolerating secretions at this time. Wife was advised to let us know of any changes or concerns.   Patient continues to tolerate secretions, but is now  coughing/spitting in a trash can. No change in condition.   Patient transferred to room 1 upon receipt of the CT results. Case discussed with Dr. Fanny Bien who assumed care.    Chinita Pester, FNP 01/31/16 0454  Sharyn Creamer, MD 02/01/16 [phone]

## 2016-01-31 NOTE — Consult Note (Signed)
[name], [name] [id number] Jun 09, 1991 Francisco Blazeravid Matthew Robles,*  Reason for Consult: Left peritonsillar abscess  HPI: Patient is a 25 year old African-American male who has had a sore throat now for 5 days. He was seen 2 days ago and started on Zithromax and is continued to worsen. Now presents putting out saliva and unable to swallow. CT scan was done which showed a left peritonsillar abscess possibly 2 cm diameter  Allergies:  Allergies  Allergen Reactions  . Penicillins Rash    ROS: Review of systems normal other than 12 systems except per HPI.  PMH:  Past Medical History  Diagnosis Date  . Patient denies medical problems     FH: No family history on file.  SH:  Social History   Social History  . Marital Status: Single    Spouse Name: N/A  . Number of Children: N/A  . Years of Education: N/A   Occupational History  . Not on file.   Social History Main Topics  . Smoking status: Never Smoker   . Smokeless tobacco: Not on file  . Alcohol Use: No  . Drug Use: No  . Sexual Activity: Not on file   Other Topics Concern  . Not on file   Social History Narrative    PSH: History reviewed. No pertinent past surgical history.  Physical  Exam: Well-developed well-nourished in no respiratory distress but spitting out his saliva CN 2-12 grossly intact and symmetric. Oral cavity is left tonsil be enlarged and the uvula pushed across the midline. There is not a significant exudate on the tonsil. Skin warm and dry. Nasal cavity without polyps or purulence. External nose and ears without masses or lesions.  Neck supple with no masses or lesions. No lymphadenopathy palpated. Thyroid normal with no masses.  I reviewed his CT scan detail shows the abscess left peritonsillar area, but swelling down in the lateral hypopharynx and up into the palate some.  Aspiration of the left peritonsillar area was done with 2 separate passes. 6 mL of right pus was removed and sent for culture. Patient  was given IV Cleocin prior to aspiration and Solu-Medrol 125 mg   A/P: She has left peritonsillar abscess that is been aspirated. He is feeling better and the is less pressure there and out pus is been relieved. He controls liquids and is okay to go home and stay well-hydrated. Use Cleocin and a prednisone taper. Plan to check in 1 week to make sure his throat is healing well. He knows to return to the office sooner if he should be getting worse. He understands that if aspiration does not work then he will need an operation to drain this further and to possibly remove his tonsils.   [name] H 01/31/2016 11:43 PM

## 2016-01-31 NOTE — ED Notes (Signed)
MD Jengel at bedside at this time

## 2016-01-31 NOTE — ED Provider Notes (Signed)
----------------------------------------- 10:23 PM on 01/31/2016 -----------------------------------------  Multidetector CT imaging of the neck was performed using the standard protocol following the bolus administration of intravenous contrast.  CONTRAST: 75mL ISOVUE-300 IOPAMIDOL (ISOVUE-300) INJECTION 61%  COMPARISON: Prior study from 08/11/2013.  FINDINGS: Visualized portions of the brain are unremarkable.  Visualized paranasal sinuses are clear. Visualized mastoid air cells are well pneumatized. Middle ear cavities are clear.  Salivary glands including the parotid glands and submandibular glands are within normal limits.  Oral cavity itself is normal. No acute abnormality about the dentition.  Asymmetric swelling and edema within the left palatine tonsil. There is a in hypodense rim enhancing collection just lateral and superior to the left tonsil that measures 1.7 x 2.2 x 3.0 cm, consistent with tonsillar/ peritonsillar abscess. The swelling within the adjacent left oropharyngeal mucosa as well as the uvula. Inflammatory stranding within the left parapharyngeal fat. Mild inflammatory stranding extends inferiorly and anteriorly into the left submandibular space as well. Associated retropharyngeal effusion without frank abscess. Layering secretions within the hypopharynx at the level of the piriform sinuses. Swelling extends within the pharyngeal mucosa down to the left piriform sinus. Oropharyngeal airway is somewhat narrowed but remains patent. Epiglottis itself is normal. Vallecula is clear. Aryepiglottic folds without significant edema. True cords normal. Subglottic airway clear.  Enlarged bilateral level 2 lymph nodes measure up to 16 mm on the left and 13 mm on the right, likely reactive. No other significant adenopathy within the neck.  Thyroid gland is normal.  Visualized superior mediastinum within normal limits.  Visualized lungs are clear.  Normal  intravascular enhancement seen throughout the neck.  No acute osseous abnormality. No worrisome lytic or blastic osseous lesions.  IMPRESSION: 1. 1.7 x 2.2 x 3.0 cm left tonsillar/peritonsillar abscess. There is associated swelling and edema within the left oropharynx/ pharynx with inflammatory stranding within the left parapharyngeal and submandibular spaces. Retropharyngeal effusion without frank abscess. Oropharyngeal airway narrowed but remains patent at this time. 2. Bilateral level II adenopathy, likely reactive.   Electronically Signed By: Rise MuBenjamin McClintock M.D. On: 01/31/2016 22:03          CRITICAL CARE Performed by: Sharyn CreamerQUALE, MARK   Total critical care time: 35 minutes  Critical care time was exclusive of separately billable procedures and treating other patients.  Critical care was necessary to treat or prevent imminent or life-threatening deterioration.  Critical care was time spent personally by me on the following activities: development of treatment plan with patient and/or surrogate as well as nursing, discussions with consultants, evaluation of patient's response to treatment, examination of patient, obtaining history from patient or surrogate, ordering and performing treatments and interventions, ordering and review of laboratory studies, ordering and review of radiographic studies, pulse oximetry and re-evaluation of patient's condition.  Patient with tonsillar abscess and airway edema requiring careful evaluation and monitoring for airway compromise.   Medical screening examination/treatment/procedure(s) were conducted as a shared visit with non-physician practitioner(s) and myself.  I personally evaluated the patient during the encounter.  ----------------------------------------- 10:29 PM on 01/31/2016 -----------------------------------------  Patient reports he has not had any chill breathing at present. He is sitting up alert, slightly  diaphoretic but in no acute distress. He reports pain is well-controlled. He does have moderate swelling to the left side of the neck, is able open his jaw and the oropharynx does appear to be patent with left tonsillar edema and swelling. I have discussed with Dr Elenore RotaJuengel of ear nose and throat who will be coming to see and  evaluate the patient. Starting IV clindamycin. We'll continue to monitor the patient carefully. He has no stridor, he is having to spit up occasionally at this time. Continue to monitor closely for airway compromise. At the present time no evidence to suggest acute airway compromise.  ----------------------------------------- 12:06 AM on 02/01/2016 -----------------------------------------  Patient arrived by your nose and throat, abscess drained. Does report some relief but still difficulty with swallowing. No trouble breathing, no stridor. We'll admit to the hospital for antibiotics, steroids, and close observation due to difficulty with swallowing and airway monitoring.  Impression Peritonsillar abscess  Sharyn Creamer, MD 02/01/16 0006

## 2016-01-31 NOTE — Op Note (Signed)
01/31/2016  11:40 PM    Jerolyn ShinJones, [place]  [id number]   Pre-Op Dx:  Left peritonsillar abscess  Post-op Dx: Left peritonsillar abscess  Proc: Aspiration left peritonsillar abscess   Surg:  Keisa Blow H  Anes:  GOT  EBL:  10 mL  Comp:  None  Findings:  Thick yellow brown mucopurulent sputum aspirated, approximately 6 mL  Procedure: Patient was seen in the emergency room. His oropharynx was sprayed with Hurricaine spray. A 10 mL syringe was used with an 18-gauge needle. Aspiration was made in the left peritonsillar area. Shortly 1 mL of mucopurulent was aspirated. A second syringe and needle were used for aspirating slightly below and lateral to the first aspiration. This time approximate 5 mL of mucopurulence was removed. Patient has small amount of bleeding from the sites and had him gargle to help wash this away. He felt better as the pressure was relieved.  Dispo:   To be discharged home on oral Cleocin  Plan:  Follow up in 1 week to make sure the infection is resolved. Return sooner if worsening. He understands that if this does not work and infection returns then surgical drainage will be required.  Dollene Mallery H  01/31/2016 11:40 PM

## 2016-01-31 NOTE — ED Notes (Signed)
Sore throat x 5 days

## 2016-02-01 ENCOUNTER — Encounter: Payer: Self-pay | Admitting: Internal Medicine

## 2016-02-01 DIAGNOSIS — J36 Peritonsillar abscess: Secondary | ICD-10-CM | POA: Diagnosis present

## 2016-02-01 LAB — CBC
HEMATOCRIT: 43.6 % (ref 40.0–52.0)
Hemoglobin: 14.4 g/dL (ref 13.0–18.0)
MCH: 27.9 pg (ref 26.0–34.0)
MCHC: 33.1 g/dL (ref 32.0–36.0)
MCV: 84.3 fL (ref 80.0–100.0)
Platelets: 142 10*3/uL — ABNORMAL LOW (ref 150–440)
RBC: 5.17 MIL/uL (ref 4.40–5.90)
RDW: 13.6 % (ref 11.5–14.5)
WBC: 12.4 10*3/uL — ABNORMAL HIGH (ref 3.8–10.6)

## 2016-02-01 LAB — BASIC METABOLIC PANEL
Anion gap: 7 (ref 5–15)
BUN: 14 mg/dL (ref 6–20)
CHLORIDE: 104 mmol/L (ref 101–111)
CO2: 25 mmol/L (ref 22–32)
CREATININE: 1.11 mg/dL (ref 0.61–1.24)
Calcium: 9.1 mg/dL (ref 8.9–10.3)
GFR calc Af Amer: >60 mL/min (ref >60)
GLUCOSE: 139 mg/dL — AB (ref 65–99)
POTASSIUM: 4.2 mmol/L (ref 3.5–5.1)
Sodium: 136 mmol/L (ref 135–145)

## 2016-02-01 MED ORDER — ACETAMINOPHEN 650 MG RE SUPP: 650.0000 mg | Freq: Four times a day (QID) | RECTAL | Status: DC | PRN

## 2016-02-01 MED ORDER — ONDANSETRON HCL 4 MG PO TABS: 4.0000 mg | ORAL_TABLET | Freq: Four times a day (QID) | ORAL | Status: DC | PRN

## 2016-02-01 MED ORDER — CLINDAMYCIN PHOSPHATE 900 MG/50ML IV SOLN
900.0000 mg | Freq: Four times a day (QID) | INTRAVENOUS | Status: DC
  Administered 2016-02-01 – 2016-02-02 (×6): 900 mg via INTRAVENOUS
  Filled 2016-02-01 (×9): qty 50

## 2016-02-01 MED ORDER — ONDANSETRON HCL 4 MG/2ML IJ SOLN
4.0000 mg | Freq: Four times a day (QID) | INTRAMUSCULAR | Status: DC | PRN
  Administered 2016-02-01: 4 mg via INTRAVENOUS
  Filled 2016-02-01: qty 2

## 2016-02-01 MED ORDER — LIDOCAINE VISCOUS 2 % MT SOLN
5.0000 mL | Freq: Four times a day (QID) | OROMUCOSAL | Status: DC | PRN
  Filled 2016-02-01: qty 5

## 2016-02-01 MED ORDER — METHYLPREDNISOLONE SODIUM SUCC 125 MG IJ SOLR
60.0000 mg | INTRAMUSCULAR | Status: DC
  Administered 2016-02-01 (×2): 60 mg via INTRAVENOUS
  Filled 2016-02-01 (×2): qty 2

## 2016-02-01 MED ORDER — MORPHINE SULFATE (PF) 2 MG/ML IV SOLN
2.0000 mg | INTRAVENOUS | Status: DC | PRN
  Administered 2016-02-01: 2 mg via INTRAVENOUS
  Filled 2016-02-01: qty 1

## 2016-02-01 MED ORDER — ACETAMINOPHEN 325 MG PO TABS: 650.0000 mg | ORAL_TABLET | Freq: Four times a day (QID) | ORAL | Status: DC | PRN

## 2016-02-01 MED ORDER — PHENOL 1.4 % MT LIQD
1.0000 | OROMUCOSAL | Status: DC | PRN
  Filled 2016-02-01: qty 177

## 2016-02-01 MED ORDER — DEXTROSE-NACL 5-0.9 % IV SOLN
INTRAVENOUS | Status: DC
  Administered 2016-02-01 – 2016-02-02 (×4): via INTRAVENOUS

## 2016-02-01 MED ORDER — POTASSIUM CHLORIDE 10 MEQ/100ML IV SOLN
10.0000 meq | INTRAVENOUS | Status: AC
  Administered 2016-02-01 (×2): 10 meq via INTRAVENOUS
  Filled 2016-02-01 (×2): qty 100

## 2016-02-01 NOTE — Progress Notes (Signed)
Patient ID: [name], male   DOB: [birth year], 26 y.o.   MRN: [id number] [facility] [place] [facility]   PATIENT NAME: [name]    MR#:  [id number]  DATE OF BIRTH:  [birth year]  SUBJECTIVE:  Came in with sore throat was found to have left tonsillar abscess underwent IND in the emergency room by ENT with her more about 6-7 cc of pus. Patient felt better able to swallow saliva  REVIEW OF SYSTEMS:   Review of Systems  Constitutional: Negative for fever, chills and weight loss.  HENT: Positive for sore throat. Negative for ear discharge, ear pain and nosebleeds.   Eyes: Negative for blurred vision, pain and discharge.  Respiratory: Negative for sputum production, shortness of breath, wheezing and stridor.   Cardiovascular: Negative for chest pain, palpitations, orthopnea and PND.  Gastrointestinal: Negative for nausea, vomiting, abdominal pain and diarrhea.  Genitourinary: Negative for urgency and frequency.  Musculoskeletal: Negative for back pain and joint pain.  Neurological: Negative for sensory change, speech change, focal weakness and weakness.  Psychiatric/Behavioral: Negative for depression and hallucinations. The patient is not nervous/anxious.   All other systems reviewed and are negative.  Tolerating Diet:cld Tolerating PT: not needed  DRUG ALLERGIES:   Allergies  Allergen Reactions  . Penicillins Rash    VITALS:  Blood pressure 152/87, pulse 56, temperature 98.4 F (36.9 C), temperature source Oral, resp. rate 16, height 6\' 4"  (1.93 m), weight 115.259 kg (254 lb 1.6 oz), SpO2 97 %.  PHYSICAL EXAMINATION:   Physical Exam  GENERAL:  25 y.o.-year-old patient lying in the bed with no acute distress.  EYES: Pupils equal, round, reactive to light and accommodation. No scleral icterus. Extraocular muscles intact.  HEENT: Head atraumatic, normocephalic. Erythematous and redness around the left peritonsillar area. No abscess seen. NECK:   Supple, no jugular venous distention. No thyroid enlargement, no tenderness.  LUNGS: Normal breath sounds bilaterally, no wheezing, rales, rhonchi. No use of accessory muscles of respiration.  CARDIOVASCULAR: S1, S2 normal. No murmurs, rubs, or gallops.  ABDOMEN: Soft, nontender, nondistended. Bowel sounds present. No organomegaly or mass.  EXTREMITIES: No cyanosis, clubbing or edema b/l.    NEUROLOGIC: Cranial nerves II through XII are intact. No focal Motor or sensory deficits b/l.   PSYCHIATRIC:  patient is alert and oriented x 3.  SKIN: No obvious rash, lesion, or ulcer.   LABORATORY PANEL:  CBC  Recent Labs Lab 02/01/16 0705  WBC 12.4*  HGB 14.4  HCT 43.6  PLT 142*    Chemistries   Recent Labs Lab 01/31/16 1954 02/01/16 0705  NA 135 136  K 3.3* 4.2  CL 100* 104  CO2 26 25  GLUCOSE 101* 139*  BUN 14 14  CREATININE 1.33* 1.11  CALCIUM 9.3 9.1  AST 36  --   ALT 46  --   ALKPHOS 52  --   BILITOT 1.5*  --    Cardiac Enzymes No results for input(s): TROPONINI in the last 168 hours. RADIOLOGY:  Ct Soft Tissue Neck W Contrast  01/31/2016  CLINICAL DATA:  Initial evaluation for acute sore throat. EXAM: CT NECK WITH CONTRAST TECHNIQUE: Multidetector CT imaging of the neck was performed using the standard protocol following the bolus administration of intravenous contrast. CONTRAST:  75mL ISOVUE-300 IOPAMIDOL (ISOVUE-300) INJECTION 61% COMPARISON:  Prior study from 08/11/2013. FINDINGS: Visualized portions of the brain are unremarkable. Visualized paranasal sinuses are clear. Visualized mastoid air cells are well pneumatized. Middle ear cavities  are clear. Salivary glands including the parotid glands and submandibular glands are within normal limits. Oral cavity itself is normal. No acute abnormality about the dentition. Asymmetric swelling and edema within the left palatine tonsil. There is a in hypodense rim enhancing collection just lateral and superior to the left tonsil  that measures 1.7 x 2.2 x 3.0 cm, consistent with tonsillar/ peritonsillar abscess. The swelling within the adjacent left oropharyngeal mucosa as well as the uvula. Inflammatory stranding within the left parapharyngeal fat. Mild inflammatory stranding extends inferiorly and anteriorly into the left submandibular space as well. Associated retropharyngeal effusion without frank abscess. Layering secretions within the hypopharynx at the level of the piriform sinuses. Swelling extends within the pharyngeal mucosa down to the left piriform sinus. Oropharyngeal airway is somewhat narrowed but remains patent. Epiglottis itself is normal. Vallecula is clear. Aryepiglottic folds without significant edema. True cords normal. Subglottic airway clear. Enlarged bilateral level 2 lymph nodes measure up to 16 mm on the left and 13 mm on the right, likely reactive. No other significant adenopathy within the neck. Thyroid gland is normal. Visualized superior mediastinum within normal limits. Visualized lungs are clear. Normal intravascular enhancement seen throughout the neck. No acute osseous abnormality. No worrisome lytic or blastic osseous lesions. IMPRESSION: 1. 1.7 x 2.2 x 3.0 cm left tonsillar/peritonsillar abscess. There is associated swelling and edema within the left oropharynx/ pharynx with inflammatory stranding within the left parapharyngeal and submandibular spaces. Retropharyngeal effusion without frank abscess. Oropharyngeal airway narrowed but remains patent at this time. 2. Bilateral level II adenopathy, likely reactive. Electronically Signed   By: Rise Mu M.D.   On: 01/31/2016 22:03   ASSESSMENT AND PLAN:   25 year old male patient with no significant past medical history presented with sore throat for 5 days. CT soft tissue neck showed a retropharyngeal abscess.  1. acute left Peritonsillar abscess -We'll start clear liquid diet advance to soft diet as tolerated. -Patient's saturations are  normal. He is able to swallow his saliva. No respiratory distress. -Continue IV clindamycin -Appreciate ENT help with I&D of left peritonsillar abscess in the emergency room. Cultures pending.  2. Dysphagia due to 1. Improving  3. Hypokalemia replete  4. Leukocytosis due to peritonsillar abscess If remains stable continues to tolerate by mouth diet discharge in the morning with by mouth clindamycin for total 10 days and follow up with ENT as outpatient. Case discussed with Care Management/Social Worker. Management plans discussed with the patient  and in agreement.  CODE STATUS: Full  DVT Prophylaxis: Lovenox  TOTAL TIME TAKING CARE OF THIS PATIENT: 30 minutes.  >50% time spent on counselling and coordination of care  POSSIBLE D/C IN a.m. DEPENDING ON CLINICAL CONDITION.  Note: This dictation was prepared with Dragon dictation along with smaller phrase technology. Any transcriptional errors that result from this process are unintentional.  Kaelum Kissick M.D on 02/01/2016 at 11:13 AM  Between 7am to 6pm - Pager - [phone]  After 6pm go to [email or web address] - password [email or web address]  [facility]  Office  [phone]  CC: Primary care physician; No primary care provider on file.

## 2016-02-01 NOTE — H&P (Signed)
[facility] - [place] at [facility]   PATIENT NAME: [name]    MR#:  [id number]  DATE OF BIRTH:  October 16, 1991  DATE OF ADMISSION:  01/31/2016  PRIMARY CARE PHYSICIAN: No primary care provider on file.   REQUESTING/REFERRING PHYSICIAN:   CHIEF COMPLAINT:   Chief Complaint  Patient presents with  . Sore Throat    HISTORY OF PRESENT ILLNESS: Darshawn Boateng  is a 25 y.o. male with no significant past medical history presented to the emergency room with sore throat. Patient has a sore throat for the last 5 days. He was seen in the emergency room couple of days ago and was given oral Zithromax antibiotic. His sore throat has worsened and he also has low-grade fever. Patient was again seen in the ER today and upon examination was found to have a tonsillar abscess approximately 2 cm in diameter. It was worked up with a CT scan of the neck and soft tissue which revealed peritonsillar abscess. Emergency room services contacted ENT physician who did a incision and drainage of the abscess and recommended IV antibiotics. Patient was started on IV clindamycin antibiotic. Patient has pain in the throat and secondary to which is not able to swallow any food. Hospitalist service was consulted for further care.   PAST MEDICAL HISTORY:   Past Medical History  Diagnosis Date  . Patient denies medical problems     PAST SURGICAL HISTORY: Past Surgical History  Procedure Laterality Date  . None      SOCIAL HISTORY:  Social History  Substance Use Topics  . Smoking status: Never Smoker   . Smokeless tobacco: Not on file  . Alcohol Use: No    FAMILY HISTORY:  Family History  Problem Relation Age of Onset  . Hypertension Mother     DRUG ALLERGIES:  Allergies  Allergen Reactions  . Penicillins Rash    REVIEW OF SYSTEMS:   CONSTITUTIONAL: Has fever, no fatigue or weakness.  EYES: No blurred or double vision.  EARS, NOSE, AND THROAT: No tinnitus or ear pain. Has  throat pain. RESPIRATORY: No cough, shortness of breath, wheezing or hemoptysis.  CARDIOVASCULAR: No chest pain, orthopnea, edema.  GASTROINTESTINAL: No nausea, vomiting, diarrhea or abdominal pain. Pain in throat on swallowing the food. GENITOURINARY: No dysuria, hematuria.  ENDOCRINE: No polyuria, nocturia,  HEMATOLOGY: No anemia, easy bruising or bleeding SKIN: No rash or lesion. MUSCULOSKELETAL: No joint pain or arthritis.   NEUROLOGIC: No tingling, numbness, weakness.  PSYCHIATRY: No anxiety or depression.   MEDICATIONS AT HOME:  Prior to Admission medications   Medication Sig Start Date End Date Taking? Authorizing Provider  azithromycin (ZITHROMAX) 500 MG tablet Take 1 tablet (500 mg total) by mouth daily. Take 1 tablet daily for 3 days. 10/29/15   Joni Reining, PA-C  brompheniramine-pseudoephedrine-DM 30-2-10 MG/5ML syrup Take 5 mLs by mouth 4 (four) times daily as needed. 10/29/15   Joni Reining, PA-C  ibuprofen (ADVIL,MOTRIN) 800 MG tablet Take 1 tablet (800 mg total) by mouth every 8 (eight) hours as needed. 10/29/15   Joni Reining, PA-C  lidocaine (XYLOCAINE) 2 % solution Use as directed 5 mLs in the mouth or throat every 6 (six) hours as needed for mouth pain. Mixed with 5 ML's of Phenergan DM with swish and swallow. 01/29/16   Joni Reining, PA-C  methylPREDNISolone (MEDROL DOSEPAK) 4 MG TBPK tablet Take Tapered dose as directed 01/29/16   Joni Reining, PA-C  promethazine-dextromethorphan (PROMETHAZINE-DM) 6.25-15 MG/5ML  syrup Take 5 mLs by mouth 4 (four) times daily as needed for cough. Mixed with 5 ML's of viscous lidocaine for swish and swallow. 01/29/16   Joni Reining, PA-C      PHYSICAL EXAMINATION:   VITAL SIGNS: Blood pressure 150/86, pulse 83, temperature 99.9 F (37.7 C), temperature source Oral, resp. rate 19, height  (1.93 m), weight 118.842 kg (262 lb), SpO2 98 %.  GENERAL:  25 y.o.-year-old patient lying in the bed with no acute distress.  EYES:  Pupils equal, round, reactive to light and accommodation. No scleral icterus. Extraocular muscles intact.  HEENT: Head atraumatic, normocephalic. Oropharynx redness noted. Has tonsillar swelling,edema NECK:  Supple, no jugular venous distention. No thyroid enlargement, no tenderness. Lymphadenopathy noted. LUNGS: Normal breath sounds bilaterally, no wheezing, rales,rhonchi or crepitation. No use of accessory muscles of respiration.  CARDIOVASCULAR: S1, S2 normal. No murmurs, rubs, or gallops.  ABDOMEN: Soft, nontender, nondistended. Bowel sounds present. No organomegaly or mass.  EXTREMITIES: No pedal edema, cyanosis, or clubbing.  NEUROLOGIC: Cranial nerves II through XII are intact. Muscle strength 5/5 in all extremities. Sensation intact. Gait normal. PSYCHIATRIC: The patient is alert and oriented x 3.  SKIN: No obvious rash, lesion, or ulcer.   LABORATORY PANEL:   CBC  Recent Labs Lab 01/31/16 1954  WBC 10.7*  HGB 14.6  HCT 43.9  PLT 137*  MCV 83.5  MCH 27.7  MCHC 33.2  RDW 13.7  LYMPHSABS 1.2  MONOABS 1.3*  EOSABS 0.0  BASOSABS 0.0   ------------------------------------------------------------------------------------------------------------------  Chemistries   Recent Labs Lab 01/31/16 1954  NA 135  K 3.3*  CL 100*  CO2 26  GLUCOSE 101*  BUN 14  CREATININE 1.33*  CALCIUM 9.3  AST 36  ALT 46  ALKPHOS 52  BILITOT 1.5*   ------------------------------------------------------------------------------------------------------------------ estimated creatinine clearance is 119.6 mL/min (by C-G formula based on Cr of 1.33). ------------------------------------------------------------------------------------------------------------------ No results for input(s): TSH, T4TOTAL, T3FREE, THYROIDAB in the last 72 hours.  Invalid input(s): FREET3   Coagulation profile No results for input(s): INR, PROTIME in the last 168  hours. ------------------------------------------------------------------------------------------------------------------- No results for input(s): DDIMER in the last 72 hours. -------------------------------------------------------------------------------------------------------------------  Cardiac Enzymes No results for input(s): CKMB, TROPONINI, MYOGLOBIN in the last 168 hours.  Invalid input(s): CK ------------------------------------------------------------------------------------------------------------------ Invalid input(s): POCBNP  ---------------------------------------------------------------------------------------------------------------  Urinalysis    Component Value Date/Time   COLORURINE Amber 01/01/2014 1059   APPEARANCEUR Hazy 01/01/2014 1059   LABSPEC 1.034 01/01/2014 1059   PHURINE 5.0 01/01/2014 1059   GLUCOSEU Negative 01/01/2014 1059   HGBUR Negative 01/01/2014 1059   BILIRUBINUR Negative 01/01/2014 1059   KETONESUR Trace 01/01/2014 1059   PROTEINUR 100 mg/dL 16/07/9603 5409   NITRITE Negative 01/01/2014 1059   LEUKOCYTESUR Negative 01/01/2014 1059     RADIOLOGY: Ct Soft Tissue Neck W Contrast  01/31/2016  CLINICAL DATA:  Initial evaluation for acute sore throat. EXAM: CT NECK WITH CONTRAST TECHNIQUE: Multidetector CT imaging of the neck was performed using the standard protocol following the bolus administration of intravenous contrast. CONTRAST:  75mL ISOVUE-300 IOPAMIDOL (ISOVUE-300) INJECTION 61% COMPARISON:  Prior study from 08/11/2013. FINDINGS: Visualized portions of the brain are unremarkable. Visualized paranasal sinuses are clear. Visualized mastoid air cells are well pneumatized. Middle ear cavities are clear. Salivary glands including the parotid glands and submandibular glands are within normal limits. Oral cavity itself is normal. No acute abnormality about the dentition. Asymmetric swelling and edema within the left palatine tonsil. There is a  in hypodense rim enhancing collection  just lateral and superior to the left tonsil that measures 1.7 x 2.2 x 3.0 cm, consistent with tonsillar/ peritonsillar abscess. The swelling within the adjacent left oropharyngeal mucosa as well as the uvula. Inflammatory stranding within the left parapharyngeal fat. Mild inflammatory stranding extends inferiorly and anteriorly into the left submandibular space as well. Associated retropharyngeal effusion without frank abscess. Layering secretions within the hypopharynx at the level of the piriform sinuses. Swelling extends within the pharyngeal mucosa down to the left piriform sinus. Oropharyngeal airway is somewhat narrowed but remains patent. Epiglottis itself is normal. Vallecula is clear. Aryepiglottic folds without significant edema. True cords normal. Subglottic airway clear. Enlarged bilateral level 2 lymph nodes measure up to 16 mm on the left and 13 mm on the right, likely reactive. No other significant adenopathy within the neck. Thyroid gland is normal. Visualized superior mediastinum within normal limits. Visualized lungs are clear. Normal intravascular enhancement seen throughout the neck. No acute osseous abnormality. No worrisome lytic or blastic osseous lesions. IMPRESSION: 1. 1.7 x 2.2 x 3.0 cm left tonsillar/peritonsillar abscess. There is associated swelling and edema within the left oropharynx/ pharynx with inflammatory stranding within the left parapharyngeal and submandibular spaces. Retropharyngeal effusion without frank abscess. Oropharyngeal airway narrowed but remains patent at this time. 2. Bilateral level II adenopathy, likely reactive. Electronically Signed   By: Rise MuBenjamin  McClintock M.D.   On: 01/31/2016 22:03    EKG: No orders found for this or any previous visit.  IMPRESSION AND PLAN: 25 year old male patient with no significant past medical history presented with sore throat for 5 days. CT soft tissue neck showed a retropharyngeal  abscess. Admitting diagnosis 1. Peritonsillar abscess 2. Dysphagia 3. Hypokalemia 4. Leukocytosis Treatment plan Admit patient to medical floor Start patient on IV clindamycin antibiotic Pain management with IV morphine IV fluid hydration Keep patient nothing by mouth for now IV Solu-Medrol 60 mg daily Replace potassium Appreciate ENT services for intervention Supportive care.  All the records are reviewed and case discussed with ED provider. Management plans discussed with the patient, family and they are in agreement.  CODE STATUS:FULL Code Status History    This patient does not have a recorded code status. Please follow your organizational policy for patients in this situation.       TOTAL TIME TAKING CARE OF THIS PATIENT: 50 minutes.    Ihor AustinPavan Salvadore Valvano M.D on 02/01/2016 at 12:37 AM  Between 7am to 6pm - Pager - [phone]  After 6pm go to [email or web address] - password [email or web address]  [facility] [place] [facility]  Office  [phone]  CC: Primary care physician; No primary care provider on file.

## 2016-02-02 MED ORDER — CLINDAMYCIN HCL 300 MG PO CAPS: 300.0000 mg | ORAL_CAPSULE | Freq: Three times a day (TID) | ORAL | Status: DC

## 2016-02-02 MED ORDER — PREDNISONE 50 MG PO TABS: 50.0000 mg | ORAL_TABLET | Freq: Every day | ORAL | Status: DC

## 2016-02-02 NOTE — Care Management (Signed)
Patient admitted with Left peritonsillar abscess.  Patient to discharge with PO Clindamycin and prednisone.  I have printed the patient coupons for both of these from [email or web address].  Out of pocket expense $21.65 and $2.75.  Patient denies and financial concerns and states that he will be able to get these after discharge.  I have provided patient with application to [facility] and [facility].  Patient states that he is employeed and is in the process of getting approved through his employer for insurance. RNCM signing off

## 2016-02-02 NOTE — Progress Notes (Signed)
IV was removed. Discharge instructions, follow-up appointments, work note, and prescriptions were provided to the pt. The pt wanted to walk with spouse downstairs.

## 2016-02-02 NOTE — Discharge Instructions (Signed)

## 2016-02-03 NOTE — Discharge Summary (Signed)
[facility] - [place] at [facility]   PATIENT NAME: [name]    MR#:  [id number]  DATE OF BIRTH:  March 29, 1991  DATE OF ADMISSION:  01/31/2016 ADMITTING PHYSICIAN: Ihor Austin, MD  DATE OF DISCHARGE: 02/02/2016 10:45 AM  PRIMARY CARE PHYSICIAN: No primary care provider on file.   ADMISSION DIAGNOSIS:  Peritonsillar abscess [J36]  DISCHARGE DIAGNOSIS:  Principal Problem:   Tonsillar abscess   SECONDARY DIAGNOSIS:   Past Medical History  Diagnosis Date  . Patient denies medical problems      ADMITTING HISTORY  HISTORY OF PRESENT ILLNESS: [name] is a 25 y.o. male with no significant past medical history presented to the emergency room with sore throat. Patient has a sore throat for the last 5 days. He was seen in the emergency room couple of days ago and was given oral Zithromax antibiotic. His sore throat has worsened and he also has low-grade fever. Patient was again seen in the ER today and upon examination was found to have a tonsillar abscess approximately 2 cm in diameter. It was worked up with a CT scan of the neck and soft tissue which revealed peritonsillar abscess. Emergency room services contacted ENT physician who did a incision and drainage of the abscess and recommended IV antibiotics. Patient was started on IV clindamycin antibiotic. Patient has pain in the throat and secondary to which is not able to swallow any food. Hospitalist service was consulted for further care.  HOSPITAL COURSE:   Patient admitted to medical floor after having I&D of the left tonsillar abscess by Dr. Ernestine Mcmurray of ENT. Started on clindamycin and steroids. Initially patient had significant difficulty with swallowing. By the day of discharge patient is able to eat soft diet and tolerate liquids. Minimal pain. Afebrile. Normal white count. Patient discharged home in a stable condition with oral clindamycin for another 8 days and prednisone for 5 days. Follow-up with  ENT and primary care physician as outpatient.  CONSULTS OBTAINED:     DRUG ALLERGIES:   Allergies  Allergen Reactions  . Penicillins Rash    DISCHARGE MEDICATIONS:   Discharge Medication List as of 02/02/2016  9:59 AM    START taking these medications   Details  clindamycin (CLEOCIN) 300 MG capsule Take 1 capsule (300 mg total) by mouth 3 (three) times daily., Starting 02/02/2016, Until Discontinued, Print    predniSONE (DELTASONE) 50 MG tablet Take 1 tablet (50 mg total) by mouth daily with breakfast., Starting 02/02/2016, Until Discontinued, Print      CONTINUE these medications which have NOT CHANGED   Details  brompheniramine-pseudoephedrine-DM 30-2-10 MG/5ML syrup Take 5 mLs by mouth 4 (four) times daily as needed., Starting 10/29/2015, Until Discontinued, Print    ibuprofen (ADVIL,MOTRIN) 800 MG tablet Take 1 tablet (800 mg total) by mouth every 8 (eight) hours as needed., Starting 10/29/2015, Until Discontinued, Print    lidocaine (XYLOCAINE) 2 % solution Use as directed 5 mLs in the mouth or throat every 6 (six) hours as needed for mouth pain. Mixed with 5 ML's of Phenergan DM with swish and swallow., Starting 01/29/2016, Until Discontinued, Print    promethazine-dextromethorphan (PROMETHAZINE-DM) 6.25-15 MG/5ML syrup Take 5 mLs by mouth 4 (four) times daily as needed for cough. Mixed with 5 ML's of viscous lidocaine for swish and swallow., Starting 01/29/2016, Until Discontinued, Print      STOP taking these medications     azithromycin (ZITHROMAX) 500 MG tablet      methylPREDNISolone (MEDROL DOSEPAK) 4  MG TBPK tablet         Today   VITAL SIGNS:  Blood pressure 153/88, pulse 65, temperature 98.4 F (36.9 C), temperature source Oral, resp. rate 16, height 6\' 4"  (1.93 m), weight 115.259 kg (254 lb 1.6 oz), SpO2 97 %.  I/O:  No intake or output data in the 24 hours ending 02/03/16 1249  PHYSICAL EXAMINATION:  Physical Exam  GENERAL:  25 y.o.-year-old patient  lying in the bed with no acute distress.  LUNGS: Normal breath sounds bilaterally, no wheezing, rales,rhonchi or crepitation. No use of accessory muscles of respiration.  CARDIOVASCULAR: S1, S2 normal. No murmurs, rubs, or gallops.  ABDOMEN: Soft, non-tender, non-distended. Bowel sounds present. No organomegaly or mass.  NEUROLOGIC: Moves all 4 extremities. PSYCHIATRIC: The patient is alert and oriented x 3.  SKIN: No obvious rash, lesion, or ulcer.   DATA REVIEW:   CBC  Recent Labs Lab 02/01/16 0705  WBC 12.4*  HGB 14.4  HCT 43.6  PLT 142*    Chemistries   Recent Labs Lab 01/31/16 1954 02/01/16 0705  NA 135 136  K 3.3* 4.2  CL 100* 104  CO2 26 25  GLUCOSE 101* 139*  BUN 14 14  CREATININE 1.33* 1.11  CALCIUM 9.3 9.1  AST 36  --   ALT 46  --   ALKPHOS 52  --   BILITOT 1.5*  --     Cardiac Enzymes No results for input(s): TROPONINI in the last 168 hours.  Microbiology Results  Results for orders placed or performed during the hospital encounter of 01/31/16  Blood culture (routine x 2)     Status: None (Preliminary result)   Collection Time: 01/31/16  7:54 PM  Result Value Ref Range Status   Specimen Description BLOOD RIGHT ASSIST CONTROL  Final   Special Requests BOTTLES DRAWN AEROBIC AND ANAEROBIC 8CCAERO,3CCANA  Final   Culture NO GROWTH 3 DAYS  Final   Report Status PENDING  Incomplete  Blood culture (routine x 2)     Status: None (Preliminary result)   Collection Time: 01/31/16  7:55 PM  Result Value Ref Range Status   Specimen Description BLOOD LEFT ASSIST CONTROL  Final   Special Requests BOTTLES DRAWN AEROBIC AND ANAEROBIC 8CCAERO,8CCANA  Final   Culture NO GROWTH 3 DAYS  Final   Report Status PENDING  Incomplete    RADIOLOGY:  No results found.  Follow up with PCP in 1 week.  Management plans discussed with the patient, family and they are in agreement.  CODE STATUS:  Code Status History    Date Active Date Inactive Code Status Order ID  Comments User Context   02/01/2016  1:56 AM 02/02/2016  2:06 PM Full Code [id number]  Ihor AustinPavan Pyreddy, MD Inpatient      TOTAL TIME TAKING CARE OF THIS PATIENT ON DAY OF DISCHARGE: more than 30 minutes.   Milagros LollSudini, Aleicia Kenagy R M.D on 02/03/2016 at 12:49 PM  Between 7am to 6pm - Pager - [phone]  After 6pm go to [email or web address] - password [email or web address] [facility]  [facility] [place] [facility]  Office  [phone]  CC: Primary care physician; No primary care provider on file.  Note: This dictation was prepared with Dragon dictation along with smaller phrase technology. Any transcriptional errors that result from this process are unintentional.

## 2016-02-05 LAB — CULTURE, BLOOD (ROUTINE X 2)
Culture: NO GROWTH
Culture: NO GROWTH

## 2016-04-01 ENCOUNTER — Emergency Department
Admission: EM | Admit: 2016-04-01 | Discharge: 2016-04-01 | Disposition: A | Discharge status: Home / Self Care | Payer: Self-pay | Attending: Emergency Medicine | Admitting: Emergency Medicine

## 2016-04-01 ENCOUNTER — Encounter: Payer: Self-pay | Admitting: *Deleted

## 2016-04-01 DIAGNOSIS — J029 Acute pharyngitis, unspecified: Secondary | ICD-10-CM | POA: Insufficient documentation

## 2016-04-01 DIAGNOSIS — Z79899 Other long term (current) drug therapy: Secondary | ICD-10-CM | POA: Insufficient documentation

## 2016-04-01 DIAGNOSIS — Z791 Long term (current) use of non-steroidal anti-inflammatories (NSAID): Secondary | ICD-10-CM | POA: Insufficient documentation

## 2016-04-01 MED ORDER — TRAMADOL HCL 50 MG PO TABS: 50.0000 mg | ORAL_TABLET | Freq: Four times a day (QID) | ORAL | Status: AC | PRN

## 2016-04-01 MED ORDER — METHYLPREDNISOLONE 4 MG PO TBPK: ORAL_TABLET | ORAL | Status: AC

## 2016-04-01 MED ORDER — CLINDAMYCIN PHOSPHATE 600 MG/50ML IV SOLN
600.0000 mg | Freq: Once | INTRAVENOUS | Status: AC
  Administered 2016-04-01: 600 mg via INTRAVENOUS
  Filled 2016-04-01: qty 50

## 2016-04-01 MED ORDER — METHYLPREDNISOLONE SODIUM SUCC 125 MG IJ SOLR
125.0000 mg | Freq: Once | INTRAMUSCULAR | Status: AC
  Administered 2016-04-01: 125 mg via INTRAVENOUS
  Filled 2016-04-01: qty 2

## 2016-04-01 MED ORDER — CLINDAMYCIN HCL 150 MG PO CAPS: 150.0000 mg | ORAL_CAPSULE | Freq: Four times a day (QID) | ORAL | Status: DC

## 2016-04-01 MED ORDER — SODIUM CHLORIDE 0.9 % IV BOLUS (SEPSIS)
1000.0000 mL | Freq: Once | INTRAVENOUS | Status: AC
  Administered 2016-04-01: 1000 mL via INTRAVENOUS

## 2016-04-01 MED ORDER — LIDOCAINE VISCOUS 2 % MT SOLN: 5.0000 mL | Freq: Four times a day (QID) | OROMUCOSAL | Status: AC | PRN

## 2016-04-01 MED ORDER — HYDROMORPHONE HCL 1 MG/ML IJ SOLN
1.0000 mg | Freq: Once | INTRAMUSCULAR | Status: AC
Start: 2016-04-01 — End: 2016-04-01
  Administered 2016-04-01: 1 mg via INTRAVENOUS
  Filled 2016-04-01: qty 1

## 2016-04-01 NOTE — ED Notes (Signed)
Pt reports sore throat for 4 days.  Pt also has a headache.  Pt alert.

## 2016-04-01 NOTE — ED Notes (Signed)
  Reviewed d/c instructions, follow-up care, and prescriptions with pt. Pt verbalized understanding 

## 2016-04-01 NOTE — ED Notes (Addendum)
Presents with a 4 day hx of sore throat , fever body aches and headache.  Decreased PO intake d/t pain with swallowing unable to open mouth much redness and swelling noted to back of throat

## 2016-04-01 NOTE — Discharge Instructions (Signed)

## 2016-04-01 NOTE — ED Provider Notes (Signed)
[facility] Emergency Department Provider Note   ____________________________________________  Time seen: Approximately 5:51 PM  I have reviewed the triage vital signs and the nursing notes.   HISTORY  Chief Complaint Sore Throat    HPI [name] is a 25 y.o. male patient complaining of 4 days of sore throat, fever, body aches, and frontal headache. Patient state increased pain with swallowing. No palliative measures taken for this complaint. Patient rates his pain discomfort as 8/10. Patient denies any nausea vomiting diarrhea. Patient stated decreased oral intake secondary to dysphagia.   Past Medical History  Diagnosis Date  . Patient denies medical problems     Patient Active Problem List   Diagnosis Date Noted  . Tonsillar abscess 02/01/2016    Past Surgical History  Procedure Laterality Date  . None      Current Outpatient Rx  Name  Route  Sig  Dispense  Refill  . brompheniramine-pseudoephedrine-DM 30-2-10 MG/5ML syrup   Oral   Take 5 mLs by mouth 4 (four) times daily as needed.   120 mL   0   . clindamycin (CLEOCIN) 150 MG capsule   Oral   Take 1 capsule (150 mg total) by mouth 4 (four) times daily.   40 capsule   0   . clindamycin (CLEOCIN) 300 MG capsule   Oral   Take 1 capsule (300 mg total) by mouth 3 (three) times daily.   24 capsule   0   . ibuprofen (ADVIL,MOTRIN) 800 MG tablet   Oral   Take 1 tablet (800 mg total) by mouth every 8 (eight) hours as needed.   30 tablet   0   . lidocaine (XYLOCAINE) 2 % solution   Mouth/Throat   Use as directed 5 mLs in the mouth or throat every 6 (six) hours as needed for mouth pain. Mixed with 5 ML's of Phenergan DM with swish and swallow.   100 mL   0   . lidocaine (XYLOCAINE) 2 % solution   Mouth/Throat   Use as directed 5 mLs in the mouth or throat every 6 (six) hours as needed for mouth pain. Use as oral swish and swallow prior to taking medication or eating.   100  mL   0   . methylPREDNISolone (MEDROL DOSEPAK) 4 MG TBPK tablet      Take Tapered dose as directed   21 tablet   0   . predniSONE (DELTASONE) 50 MG tablet   Oral   Take 1 tablet (50 mg total) by mouth daily with breakfast.   4 tablet   0   . promethazine-dextromethorphan (PROMETHAZINE-DM) 6.25-15 MG/5ML syrup   Oral   Take 5 mLs by mouth 4 (four) times daily as needed for cough. Mixed with 5 ML's of viscous lidocaine for swish and swallow.   118 mL   0   . traMADol (ULTRAM) 50 MG tablet   Oral   Take 1 tablet (50 mg total) by mouth every 6 (six) hours as needed.   20 tablet   0     Allergies Penicillins  Family History  Problem Relation Age of Onset  . Hypertension Mother     Social History Social History  Substance Use Topics  . Smoking status: Never Smoker   . Smokeless tobacco: None  . Alcohol Use: No    Review of Systems Constitutional: No fever/chills Eyes: No visual changes. ENT: No sore throat. Cardiovascular: Denies chest pain. Respiratory: Denies shortness of breath.  Gastrointestinal: No abdominal pain.  No nausea, no vomiting.  No diarrhea.  No constipation. Genitourinary: Negative for dysuria. Musculoskeletal: Negative for back pain. Skin: Negative for rash. Neurological: Negative for headaches, focal weakness or numbness. Hematological/Lymphatic: Allergic/Immunilogical: Penicillin ____________________________________________   PHYSICAL EXAM:  VITAL SIGNS: ED Triage Vitals  Enc Vitals Group     BP 04/01/16 1737 151/80 mmHg     Pulse Rate 04/01/16 1737 98     Resp 04/01/16 1737 20     Temp 04/01/16 1737 100 F (37.8 C)     Temp Source 04/01/16 1737 Oral     SpO2 04/01/16 1737 100 %     Weight 04/01/16 1737 242 lb (109.77 kg)     Height 04/01/16 1737 6\' 4"  (1.93 m)     Head Cir --      Peak Flow --      Pain Score 04/01/16 1741 8     Pain Loc --      Pain Edu? --      Excl. in GC? --     Constitutional: Alert and oriented.  Well appearing and in no acute distress. Eyes: Conjunctivae are normal. PERRL. EOMI. Head: Atraumatic. Nose: No congestion/rhinnorhea. Mouth/Throat: Mucous membranes are moist.  Oropharynx erythematous. Malodorous.  Neck: No stridor.  No cervical spine tenderness to palpation. Hematological/Lymphatic/Immunilogical: Bilateral cervical lymphadenopathy. Cardiovascular: Normal rate, regular rhythm. Grossly normal heart sounds.  Good peripheral circulation. Respiratory: Normal respiratory effort.  No retractions. Lungs CTAB. Gastrointestinal: Soft and nontender. No distention. No abdominal bruits. No CVA tenderness. Musculoskeletal: No lower extremity tenderness nor edema.  No joint effusions. Neurologic:  Normal speech and language. No gross focal neurologic deficits are appreciated. No gait instability. Skin:  Skin is warm, dry and intact. No rash noted. Psychiatric: Mood and affect are normal. Speech and behavior are normal.  ____________________________________________   LABS (all labs ordered are listed, but only abnormal results are displayed)  Labs Reviewed - No data to display ____________________________________________  EKG   ____________________________________________  RADIOLOGY   ____________________________________________   PROCEDURES  Procedure(s) performed: None  Critical Care performed: No  ____________________________________________   INITIAL IMPRESSION / ASSESSMENT AND PLAN / ED COURSE  Pertinent labs & imaging results that were available during my care of the patient were reviewed by me and considered in my medical decision making (see chart for details).  Acute exudative pharyngitis. Patient given discharge Instructions. Patient [facility]. Patient given a prescription for clindamycin, ibuprofen, viscous lidocaine, tramadol, and prednisone. Patient advised he would need to follow-up with ENT for definitive evaluation of  tonsillectomy. ____________________________________________   FINAL CLINICAL IMPRESSION(S) / ED DIAGNOSES  Final diagnoses:  Exudative pharyngitis      NEW MEDICATIONS STARTED DURING THIS VISIT:  New Prescriptions   CLINDAMYCIN (CLEOCIN) 150 MG CAPSULE    Take 1 capsule (150 mg total) by mouth 4 (four) times daily.   LIDOCAINE (XYLOCAINE) 2 % SOLUTION    Use as directed 5 mLs in the mouth or throat every 6 (six) hours as needed for mouth pain. Use as oral swish and swallow prior to taking medication or eating.   METHYLPREDNISOLONE (MEDROL DOSEPAK) 4 MG TBPK TABLET    Take Tapered dose as directed   TRAMADOL (ULTRAM) 50 MG TABLET    Take 1 tablet (50 mg total) by mouth every 6 (six) hours as needed.     Note:  This document was prepared using Dragon voice recognition software and may include unintentional dictation  errors.    Joni Reining, PA-C 04/01/16 1858  Phineas Semen, MD 04/02/16 [phone]

## 2016-08-10 ENCOUNTER — Emergency Department
Admission: EM | Admit: 2016-08-10 | Discharge: 2016-08-10 | Disposition: A | Discharge status: Home / Self Care | Payer: Self-pay | Attending: Emergency Medicine | Admitting: Emergency Medicine

## 2016-08-10 ENCOUNTER — Emergency Department: Payer: Self-pay

## 2016-08-10 DIAGNOSIS — Y92007 Garden or yard of unspecified non-institutional (private) residence as the place of occurrence of the external cause: Secondary | ICD-10-CM | POA: Insufficient documentation

## 2016-08-10 DIAGNOSIS — M25572 Pain in left ankle and joints of left foot: Secondary | ICD-10-CM

## 2016-08-10 DIAGNOSIS — Z792 Long term (current) use of antibiotics: Secondary | ICD-10-CM | POA: Insufficient documentation

## 2016-08-10 DIAGNOSIS — S93402A Sprain of unspecified ligament of left ankle, initial encounter: Secondary | ICD-10-CM | POA: Insufficient documentation

## 2016-08-10 DIAGNOSIS — Y999 Unspecified external cause status: Secondary | ICD-10-CM | POA: Insufficient documentation

## 2016-08-10 DIAGNOSIS — Z791 Long term (current) use of non-steroidal anti-inflammatories (NSAID): Secondary | ICD-10-CM | POA: Insufficient documentation

## 2016-08-10 DIAGNOSIS — Y9301 Activity, walking, marching and hiking: Secondary | ICD-10-CM | POA: Insufficient documentation

## 2016-08-10 DIAGNOSIS — Z7952 Long term (current) use of systemic steroids: Secondary | ICD-10-CM | POA: Insufficient documentation

## 2016-08-10 DIAGNOSIS — X501XXA Overexertion from prolonged static or awkward postures, initial encounter: Secondary | ICD-10-CM | POA: Insufficient documentation

## 2016-08-10 NOTE — ED Triage Notes (Signed)
Pt ambulatory to Flex room for triage, no distress noted. Pt c/o left ankle pain, post stepping in hole outside last night 08/09/16. Swelling noted to left ankle on assessment.

## 2016-08-10 NOTE — ED Provider Notes (Signed)
[facility] Emergency Department Provider Note ____________________________________________  Time seen: 1928  I have reviewed the triage vital signs and the nursing notes.  HISTORY  Chief Complaint  Ankle Pain  HPI [name] is a 25 y.o. male presents to the ED for evaluation of ankle sprain occurred last night. Patient describes walking to the yard with his tennis shoes on when he actually stepped into a hole, causing him to roll his left ankle. He presents now with lateral ankle swelling and pain with ambulation. He denies any other injury at this time.  Past Medical History:  Diagnosis Date  . Patient denies medical problems     Patient Active Problem List   Diagnosis Date Noted  . Tonsillar abscess 02/01/2016    Past Surgical History:  Procedure Laterality Date  . none      Prior to Admission medications   Medication Sig Start Date End Date Taking? Authorizing Provider  brompheniramine-pseudoephedrine-DM 30-2-10 MG/5ML syrup Take 5 mLs by mouth 4 (four) times daily as needed. 10/29/15   Joni Reining, PA-C  clindamycin (CLEOCIN) 150 MG capsule Take 1 capsule (150 mg total) by mouth 4 (four) times daily. 04/01/16   Joni Reining, PA-C  clindamycin (CLEOCIN) 300 MG capsule Take 1 capsule (300 mg total) by mouth 3 (three) times daily. 02/02/16   Srikar Sudini, MD  ibuprofen (ADVIL,MOTRIN) 800 MG tablet Take 1 tablet (800 mg total) by mouth every 8 (eight) hours as needed. 10/29/15   Joni Reining, PA-C  lidocaine (XYLOCAINE) 2 % solution Use as directed 5 mLs in the mouth or throat every 6 (six) hours as needed for mouth pain. Mixed with 5 ML's of Phenergan DM with swish and swallow. 01/29/16   Joni Reining, PA-C  lidocaine (XYLOCAINE) 2 % solution Use as directed 5 mLs in the mouth or throat every 6 (six) hours as needed for mouth pain. Use as oral swish and swallow prior to taking medication or eating. 04/01/16   Joni Reining, PA-C   methylPREDNISolone (MEDROL DOSEPAK) 4 MG TBPK tablet Take Tapered dose as directed 04/01/16   Joni Reining, PA-C  predniSONE (DELTASONE) 50 MG tablet Take 1 tablet (50 mg total) by mouth daily with breakfast. 02/02/16   Milagros Loll, MD  promethazine-dextromethorphan (PROMETHAZINE-DM) 6.25-15 MG/5ML syrup Take 5 mLs by mouth 4 (four) times daily as needed for cough. Mixed with 5 ML's of viscous lidocaine for swish and swallow. 01/29/16   Joni Reining, PA-C  traMADol (ULTRAM) 50 MG tablet Take 1 tablet (50 mg total) by mouth every 6 (six) hours as needed. 04/01/16 04/01/17  Joni Reining, PA-C    Allergies Penicillins  Family History  Problem Relation Age of Onset  . Hypertension Mother     Social History Social History  Substance Use Topics  . Smoking status: Never Smoker  . Smokeless tobacco: Never Used  . Alcohol use No    Review of Systems  Constitutional: Negative for fever. Cardiovascular: Negative for chest pain. Respiratory: Negative for shortness of breath. Musculoskeletal: Negative for back pain. Left ankle pain as above. Skin: Negative for rash. Neurological: Negative for headaches, focal weakness or numbness. ____________________________________________  PHYSICAL EXAM:  VITAL SIGNS: ED Triage Vitals [08/10/16 1919]  Enc Vitals Group     BP (!) 152/61     Pulse Rate (!) 56     Resp 15     Temp 97.5 F (36.4 C)     Temp Source  Oral     SpO2 100 %     Weight 240 lb (108.9 kg)     Height 6\' 4"  (1.93 m)     Head Circumference      Peak Flow      Pain Score 10     Pain Loc      Pain Edu?      Excl. in GC?    Constitutional: Alert and oriented. Well appearing and in no distress. Head: Normocephalic and atraumatic. Cardiovascular: Normal distal pulses. Respiratory: Normal respiratory effort.  Musculoskeletal: Left ankle with obvious lateral soft tissue swelling about the lateral malleolus. Patient with normal ankle range of motion in all planes and  negative drawer sign. No calf or Achilles tenderness is appreciated. Nontender with normal range of motion in all other extremities.  Neurologic:  Antalgic gait without ataxia. Normal speech and language. No gross focal neurologic deficits are appreciated. Skin:  Skin is warm, dry and intact. No rash noted. ___________________________________________   RADIOLOGY  Left  Ankle IMPRESSION: No acute fracture or dislocation is identified. ____________________________________________  PROCEDURES  Ace wrap ____________________________________________  INITIAL IMPRESSION / ASSESSMENT AND PLAN / ED COURSE  Patient with a grade 2 ankle sprain with moderate swelling on exam. No radiologic evidence of fracture dislocation. He is fitted with an Ace wrap and given discharge instructions on RICE management. A work note provided for 3 days including today about the patient's request. He will follow-up with ortho for ongoing symptom management. He will dose previously prescribed ibuprofen 800 mg for pain relief.   Clinical Course   ____________________________________________  FINAL CLINICAL IMPRESSION(S) / ED DIAGNOSES  Final diagnoses:  Acute left ankle pain      Lissa HoardJenise V Bacon Veria Stradley, PA-C 08/10/16 2032    Nita Sicklearolina Veronese, MD 08/13/16 [phone]

## 2016-11-16 ENCOUNTER — Emergency Department
Admission: EM | Admit: 2016-11-16 | Discharge: 2016-11-16 | Disposition: A | Discharge status: Home / Self Care | Payer: BLUE CROSS/BLUE SHIELD | Attending: Emergency Medicine | Admitting: Emergency Medicine

## 2016-11-16 ENCOUNTER — Encounter: Payer: Self-pay | Admitting: Emergency Medicine

## 2016-11-16 DIAGNOSIS — S39012A Strain of muscle, fascia and tendon of lower back, initial encounter: Secondary | ICD-10-CM | POA: Diagnosis not present

## 2016-11-16 DIAGNOSIS — S3992XA Unspecified injury of lower back, initial encounter: Secondary | ICD-10-CM | POA: Diagnosis present

## 2016-11-16 DIAGNOSIS — Y9389 Activity, other specified: Secondary | ICD-10-CM | POA: Diagnosis not present

## 2016-11-16 DIAGNOSIS — Y929 Unspecified place or not applicable: Secondary | ICD-10-CM | POA: Diagnosis not present

## 2016-11-16 DIAGNOSIS — X500XXA Overexertion from strenuous movement or load, initial encounter: Secondary | ICD-10-CM | POA: Diagnosis not present

## 2016-11-16 DIAGNOSIS — Y990 Civilian activity done for income or pay: Secondary | ICD-10-CM | POA: Insufficient documentation

## 2016-11-16 MED ORDER — METHOCARBAMOL 500 MG PO TABS: 500.0000 mg | ORAL_TABLET | Freq: Four times a day (QID) | ORAL | 0 refills | Status: AC

## 2016-11-16 MED ORDER — MELOXICAM 15 MG PO TABS: 15.0000 mg | ORAL_TABLET | Freq: Every day | ORAL | 0 refills | Status: AC

## 2016-11-16 NOTE — ED Triage Notes (Signed)
Patient presents to the ED with left sided back pain that is worse when lifting his left arm.  Patient reports that he does heavy lifting at work and home.  Patient reports pain x 2 weeks.  No obvious distress at this time.  Ambulatory to triage.

## 2016-11-16 NOTE — ED Provider Notes (Signed)
[facility] Emergency Department Provider Note  ____________________________________________  Time seen: Approximately 6:19 PM  I have reviewed the triage vital signs and the nursing notes.   HISTORY  Chief Complaint Back Pain    HPI [name] is a 26 y.o. male resents emergency department complaining of left-sided lower back pain. Patient states that he does heavy lifting on a daily basis. He has had intermittent lower back pain on the left side for the past 2 weeks. He has used intermittent use of Advil does help somewhat but that has not fully alleviated all symptoms. He denies any direct trauma to the lower back. He denies any numbness or tingling in bilateral lower external knees. No saddle anesthesia, paresthesias, bowel or bladder dysfunction. She reports sensation is best described as a tight sensation.   Past Medical History:  Diagnosis Date  . Patient denies medical problems     Patient Active Problem List   Diagnosis Date Noted  . Tonsillar abscess 02/01/2016    Past Surgical History:  Procedure Laterality Date  . none      Prior to Admission medications   Medication Sig Start Date End Date Taking? Authorizing Provider  brompheniramine-pseudoephedrine-DM 30-2-10 MG/5ML syrup Take 5 mLs by mouth 4 (four) times daily as needed. 10/29/15   Joni Reining, PA-C  clindamycin (CLEOCIN) 150 MG capsule Take 1 capsule (150 mg total) by mouth 4 (four) times daily. 04/01/16   Joni Reining, PA-C  clindamycin (CLEOCIN) 300 MG capsule Take 1 capsule (300 mg total) by mouth 3 (three) times daily. 02/02/16   Srikar Sudini, MD  ibuprofen (ADVIL,MOTRIN) 800 MG tablet Take 1 tablet (800 mg total) by mouth every 8 (eight) hours as needed. 10/29/15   Joni Reining, PA-C  lidocaine (XYLOCAINE) 2 % solution Use as directed 5 mLs in the mouth or throat every 6 (six) hours as needed for mouth pain. Mixed with 5 ML's of Phenergan DM with swish and swallow. 01/29/16    Joni Reining, PA-C  lidocaine (XYLOCAINE) 2 % solution Use as directed 5 mLs in the mouth or throat every 6 (six) hours as needed for mouth pain. Use as oral swish and swallow prior to taking medication or eating. 04/01/16   Joni Reining, PA-C  meloxicam (MOBIC) 15 MG tablet Take 1 tablet (15 mg total) by mouth daily. 11/16/16   Delorise Royals Kameren Baade, PA-C  methocarbamol (ROBAXIN) 500 MG tablet Take 1 tablet (500 mg total) by mouth 4 (four) times daily. 11/16/16   Delorise Royals Baylin Cabal, PA-C  methylPREDNISolone (MEDROL DOSEPAK) 4 MG TBPK tablet Take Tapered dose as directed 04/01/16   Joni Reining, PA-C  predniSONE (DELTASONE) 50 MG tablet Take 1 tablet (50 mg total) by mouth daily with breakfast. 02/02/16   Milagros Loll, MD  promethazine-dextromethorphan (PROMETHAZINE-DM) 6.25-15 MG/5ML syrup Take 5 mLs by mouth 4 (four) times daily as needed for cough. Mixed with 5 ML's of viscous lidocaine for swish and swallow. 01/29/16   Joni Reining, PA-C  traMADol (ULTRAM) 50 MG tablet Take 1 tablet (50 mg total) by mouth every 6 (six) hours as needed. 04/01/16 04/01/17  Joni Reining, PA-C    Allergies Penicillins  Family History  Problem Relation Age of Onset  . Hypertension Mother     Social History Social History  Substance Use Topics  . Smoking status: Never Smoker  . Smokeless tobacco: Never Used  . Alcohol use No     Review of Systems  Constitutional: No fever/chills Cardiovascular: no chest pain. Respiratory: no cough. No SOB. Gastrointestinal: No abdominal pain.  No nausea, no vomiting.  No diarrhea.  No constipation. Genitourinary: Negative for dysuria. No hematuria Musculoskeletal: Wasn't for left-sided lower back pain. Skin: Negative for rash, abrasions, lacerations, ecchymosis. Neurological: Negative for headaches, focal weakness or numbness. 10-point ROS otherwise negative.  ____________________________________________   PHYSICAL EXAM:  VITAL SIGNS: ED Triage Vitals   Enc Vitals Group     BP 11/16/16 1651 (!) 150/78     Pulse Rate 11/16/16 1651 (!) 57     Resp 11/16/16 1651 18     Temp 11/16/16 1651 98 F (36.7 C)     Temp Source 11/16/16 1651 Oral     SpO2 11/16/16 1651 100 %     Weight 11/16/16 1651 245 lb (111.1 kg)     Height 11/16/16 1651 6' (1.829 m)     Head Circumference --      Peak Flow --      Pain Score 11/16/16 1652 7     Pain Loc --      Pain Edu? --      Excl. in GC? --      Constitutional: Alert and oriented. Well appearing and in no acute distress. Eyes: Conjunctivae are normal. PERRL. EOMI. Head: Atraumatic. Neck: No stridor.    Cardiovascular: Normal rate, regular rhythm. Normal S1 and S2.  Good peripheral circulation. Respiratory: Normal respiratory effort without tachypnea or retractions. Lungs CTAB. Good air entry to the bases with no decreased or absent breath sounds. Gastrointestinal: Bowel sounds 4 quadrants. Soft and nontender to palpation. No guarding or rigidity. No palpable masses. No distention. No CVA tenderness. Musculoskeletal: Full range of motion to all extremities. No gross deformities appreciated. No deformities this but inspection. Full range of motion of lumbar spine. Patient is nontender to palpation of her midline spinal processes. Patient was diffusely tender to palpation in the left-sided paraspinal muscle group. When compared with the right side, spasms are appreciated. No tenderness to palpation of bilateral sciatic notches. Dorsalis pedis pulse intact bilateral lower extremity's. Sensation intact and equal lower extremities. Neurologic:  Normal speech and language. No gross focal neurologic deficits are appreciated.  Skin:  Skin is warm, dry and intact. No rash noted. Psychiatric: Mood and affect are normal. Speech and behavior are normal. Patient exhibits appropriate insight and judgement.   ____________________________________________   LABS (all labs ordered are listed, but only abnormal  results are displayed)  Labs Reviewed - No data to display ____________________________________________  EKG   ____________________________________________  RADIOLOGY   No results found.  ____________________________________________    PROCEDURES  Procedure(s) performed:    Procedures    Medications - No data to display   ____________________________________________   INITIAL IMPRESSION / ASSESSMENT AND PLAN / ED COURSE  Pertinent labs & imaging results that were available during my care of the patient were reviewed by me and considered in my medical decision making (see chart for details).  Review of the [place] CSRS was performed in accordance of the [facility] prior to dispensing any controlled drugs.     Patient's diagnosis is consistent with lumbar strain. No indication for imaging or labs at this time.. Patient will be discharged home with prescriptions for anti-inflammatory muscle relaxer. Patient is to follow up with primary care as needed or otherwise directed. Patient is given ED precautions to return to the ED for any worsening or new symptoms.     ____________________________________________  FINAL CLINICAL  IMPRESSION(S) / ED DIAGNOSES  Final diagnoses:  Strain of lumbar region, initial encounter      NEW MEDICATIONS STARTED DURING THIS VISIT:  New Prescriptions   MELOXICAM (MOBIC) 15 MG TABLET    Take 1 tablet (15 mg total) by mouth daily.   METHOCARBAMOL (ROBAXIN) 500 MG TABLET    Take 1 tablet (500 mg total) by mouth 4 (four) times daily.        This chart was dictated using voice recognition software/Dragon. Despite best efforts to proofread, errors can occur which can change the meaning. Any change was purely unintentional.    Racheal PatchesJonathan D Rogan Ecklund, PA-C 11/16/16 1834    Nita Sicklearolina Veronese, MD 11/16/16 2249

## 2017-02-16 ENCOUNTER — Encounter: Payer: Self-pay | Admitting: Emergency Medicine

## 2017-02-16 ENCOUNTER — Emergency Department
Admission: EM | Admit: 2017-02-16 | Discharge: 2017-02-16 | Disposition: A | Discharge status: Home / Self Care | Payer: BLUE CROSS/BLUE SHIELD | Attending: Emergency Medicine | Admitting: Emergency Medicine

## 2017-02-16 DIAGNOSIS — Y9389 Activity, other specified: Secondary | ICD-10-CM | POA: Diagnosis not present

## 2017-02-16 DIAGNOSIS — S29012A Strain of muscle and tendon of back wall of thorax, initial encounter: Secondary | ICD-10-CM | POA: Diagnosis not present

## 2017-02-16 DIAGNOSIS — S39012A Strain of muscle, fascia and tendon of lower back, initial encounter: Secondary | ICD-10-CM

## 2017-02-16 DIAGNOSIS — S299XXA Unspecified injury of thorax, initial encounter: Secondary | ICD-10-CM | POA: Diagnosis present

## 2017-02-16 DIAGNOSIS — Y999 Unspecified external cause status: Secondary | ICD-10-CM | POA: Diagnosis not present

## 2017-02-16 DIAGNOSIS — X500XXA Overexertion from strenuous movement or load, initial encounter: Secondary | ICD-10-CM | POA: Insufficient documentation

## 2017-02-16 DIAGNOSIS — Y929 Unspecified place or not applicable: Secondary | ICD-10-CM | POA: Diagnosis not present

## 2017-02-16 MED ORDER — CYCLOBENZAPRINE HCL 5 MG PO TABS: 5.0000 mg | ORAL_TABLET | Freq: Three times a day (TID) | ORAL | 0 refills | Status: AC | PRN

## 2017-02-16 NOTE — ED Provider Notes (Signed)
[facility] Emergency Department Provider Note  ____________________________________________  Time seen: Approximately 11:39 AM  I have reviewed the triage vital signs and the nursing notes.   HISTORY  Chief Complaint Back Pain    HPI [name] is a 26 y.o. male that presents to the emergency department with back pain since yesterday. 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 Patient lifts 40 pound boxes at work regularly. He states the back feel "stiff and tight" and pain is on both sides. It feels like something is pulling when he is turning. No trauma. This is the second time that he has had pain like this and was relieved last time with muscle relaxers. He is wondering how he days of work this will take him out. He has been taking "pain pills from [employer]" for pain. He denies fever, shortness of breath, chest pain, nausea, vomiting, abdominal pain, dysuria, urgency, frequency, bowel or bladder dysfunction, sadddle paresthesias.   Past Medical History:  Diagnosis Date  . Patient denies medical problems     Patient Active Problem List   Diagnosis Date Noted  . Tonsillar abscess 02/01/2016    Past Surgical History:  Procedure Laterality Date  . none      Prior to Admission medications   Medication Sig Start Date End Date Taking? Authorizing Provider  brompheniramine-pseudoephedrine-DM 30-2-10 MG/5ML syrup Take 5 mLs by mouth 4 (four) times daily as needed. 10/29/15   Joni Reining, PA-C  clindamycin (CLEOCIN) 150 MG capsule Take 1 capsule (150 mg total) by mouth 4 (four) times daily. 04/01/16   Joni Reining, PA-C  clindamycin (CLEOCIN) 300 MG capsule Take 1 capsule (300 mg total) by mouth 3 (three) times daily. 02/02/16   Srikar Sudini, MD  cyclobenzaprine (FLEXERIL) 5 MG tablet Take 1 tablet (5 mg total) by mouth 3 (three) times daily as needed for muscle spasms. 02/16/17 02/23/17  Enid Derry, PA-C  ibuprofen (ADVIL,MOTRIN) 800 MG tablet Take 1 tablet (800 mg total) by mouth every 8 (eight) hours as needed. 10/29/15   Joni Reining, PA-C  lidocaine (XYLOCAINE) 2 % solution Use as directed 5 mLs in the mouth or throat every 6 (six) hours as needed for mouth pain. Mixed with 5 ML's of Phenergan DM with swish and swallow. 01/29/16   Joni Reining, PA-C  lidocaine (XYLOCAINE) 2 % solution Use as directed 5 mLs in the mouth or throat every 6 (six) hours as needed for mouth pain. Use as oral swish and swallow prior to taking medication or eating. 04/01/16   Joni Reining, PA-C  meloxicam (MOBIC) 15 MG tablet Take 1 tablet (15 mg total) by mouth daily. 11/16/16   Delorise Royals Cuthriell, PA-C  methocarbamol (ROBAXIN) 500 MG tablet Take 1 tablet (500 mg total) by mouth 4 (four) times daily. 11/16/16   Delorise Royals Cuthriell, PA-C  methylPREDNISolone (MEDROL DOSEPAK) 4 MG TBPK tablet Take Tapered dose as directed 04/01/16   Joni Reining, PA-C  predniSONE (DELTASONE) 50 MG tablet Take 1 tablet (50 mg total) by mouth daily with breakfast. 02/02/16   Milagros Loll, MD  promethazine-dextromethorphan (PROMETHAZINE-DM) 6.25-15 MG/5ML syrup Take 5 mLs by mouth 4 (four) times daily as needed for cough. Mixed with 5 ML's of viscous lidocaine for swish and swallow. 01/29/16   Joni Reining, PA-C  traMADol (ULTRAM) 50 MG tablet Take 1 tablet (50 mg total) by mouth every  6 (six) hours as needed. 04/01/16 04/01/17  Joni Reining, PA-C    Allergies Penicillins  Family History  Problem Relation Age of Onset  . Hypertension Mother     Social History Social History  Substance Use Topics  . Smoking status: Never Smoker  . Smokeless tobacco: Never Used  . Alcohol use No     Review of Systems  Constitutional: No fever/chills ENT: No upper respiratory complaints. Cardiovascular: No chest pain. Respiratory: No cough. No SOB. Gastrointestinal: No abdominal pain.  No nausea, no vomiting.  Genitourinary:  Negative for dysuria. Musculoskeletal: Positive for back pain. Skin: Negative for rash, abrasions, lacerations, ecchymosis. Neurological: Negative for headaches, numbness or tingling   ____________________________________________   PHYSICAL EXAM:  VITAL SIGNS: ED Triage Vitals  Enc Vitals Group     BP 02/16/17 1113 (!) 141/85     Pulse Rate 02/16/17 1113 (!) 50     Resp 02/16/17 1113 20     Temp 02/16/17 1113 98.2 F (36.8 C)     Temp Source 02/16/17 1113 Oral     SpO2 02/16/17 1113 99 %     Weight 02/16/17 1114 240 lb (108.9 kg)     Height 02/16/17 1114  (1.93 m)     Head Circumference --      Peak Flow --      Pain Score 02/16/17 1116 10     Pain Loc --      Pain Edu? --      Excl. in GC? --      Constitutional: Alert and oriented. Well appearing and in no acute distress. Eyes: Conjunctivae are normal. PERRL. EOMI. Head: Atraumatic. ENT:      Ears:      Nose: No congestion/rhinnorhea.      Mouth/Throat: Mucous membranes are moist.  Neck: No stridor.  No cervical spine tenderness to palpation. Cardiovascular: Normal rate, regular rhythm.  Good peripheral circulation. Respiratory: Normal respiratory effort without tachypnea or retractions. Lungs CTAB. Good air entry to the bases with no decreased or absent breath sounds. Gastrointestinal: Bowel sounds 4 quadrants. Soft and nontender to palpation. No guarding or rigidity. No palpable masses. No distention. No CVA tenderness. Musculoskeletal: Full range of motion to all extremities. No gross deformities appreciated. Diffuse tenderness to palpation over thoracic muscles. No tenderness over lumbar muscles. Neurologic:  Normal speech and language. No gross focal neurologic deficits are appreciated.  Skin:  Skin is warm, dry and intact. No rash noted.   ____________________________________________   LABS (all labs ordered are listed, but only abnormal results are displayed)  Labs Reviewed - No data to  display ____________________________________________  EKG   ____________________________________________  RADIOLOGY  No results found.  ____________________________________________    PROCEDURES  Procedure(s) performed:    Procedures    Medications - No data to display   ____________________________________________   INITIAL IMPRESSION / ASSESSMENT AND PLAN / ED COURSE  Pertinent labs & imaging results that were available during my care of the patient were reviewed by me and considered in my medical decision making (see chart for details).  Review of the [place] CSRS was performed in accordance of the [facility] prior to dispensing any controlled drugs.   Patient's diagnosis is consistent with back strain. Vital signs and exam are reassuring. No indication for imaging at this time. Patient will be discharged home with prescriptions for flexeril. Patient is to follow up with PCP as directed. Patient is given ED precautions to return to the ED for any  worsening or new symptoms.     ____________________________________________  FINAL CLINICAL IMPRESSION(S) / ED DIAGNOSES  Final diagnoses:  Back strain, initial encounter      NEW MEDICATIONS STARTED DURING THIS VISIT:  Discharge Medication List as of 02/16/2017 11:47 AM    START taking these medications   Details  cyclobenzaprine (FLEXERIL) 5 MG tablet Take 1 tablet (5 mg total) by mouth 3 (three) times daily as needed for muscle spasms., Starting Wed 02/16/2017, Until Wed 02/23/2017, Print            This chart was dictated using voice recognition software/Dragon. Despite best efforts to proofread, errors can occur which can change the meaning. Any change was purely unintentional.    Enid Derry, PA-C 02/16/17 1158    Rockne Menghini, MD 02/16/17 [id number]

## 2017-02-16 NOTE — ED Triage Notes (Signed)
Patient presents to the ED with lower to mid back pain since yesterday.  Patient denies known injury but states he has had similar back pain in the past.  Patient states, "after I rode home from work in the car, and got out of my car, my back felt really stiff."  Patient ambulatory to triage.  No obvious distress at this time.

## 2017-02-16 NOTE — ED Notes (Signed)
See triage note  Lower back since yesterday states he felt pain while trying to get out of car   Describes pain as stiffness  Ambulates well to treatment room

## 2017-03-24 IMAGING — CT CT NECK W/ CM
3 of 5 series · 12 of 33 positions shown, 14 images · IV contrast (iopamidol)
Comparison: Prior study from 08/11/2013.

CLINICAL DATA: Initial evaluation for acute sore throat.

EXAM:
CT NECK WITH CONTRAST
TECHNIQUE: Multidetector CT imaging of the neck was performed using the
standard protocol following the bolus administration of intravenous
contrast.
CONTRAST:  75mL 44FM9D-LZZ IOPAMIDOL (44FM9D-LZZ) INJECTION 61%

[Series 4: sag neck · sagittal · 0.53mm/px · 5 of 101 slices shown, 6 images]
[im 34/101  bone]
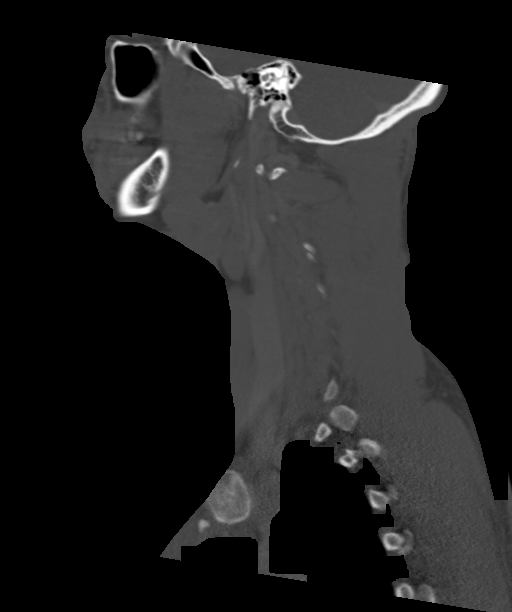
[im 42/101  bone]
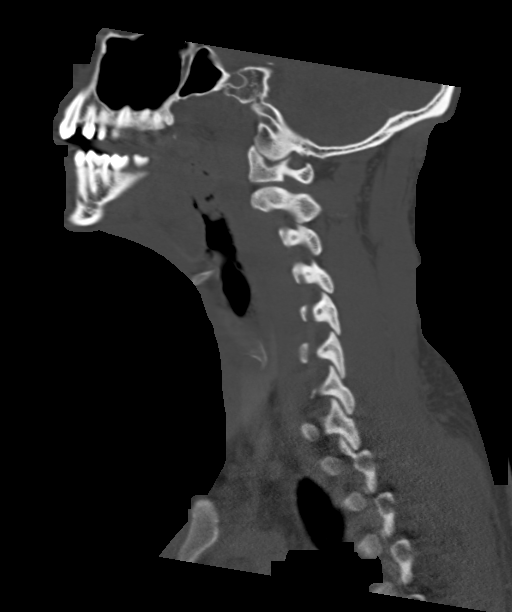
[im 51/101  soft-tissue]
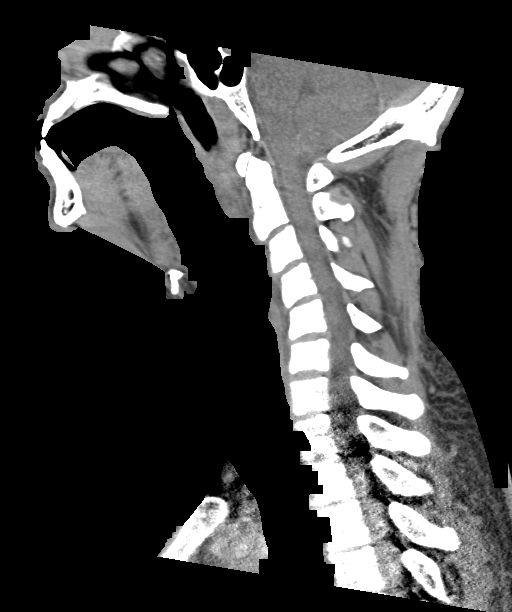
[im 51/101  bone]
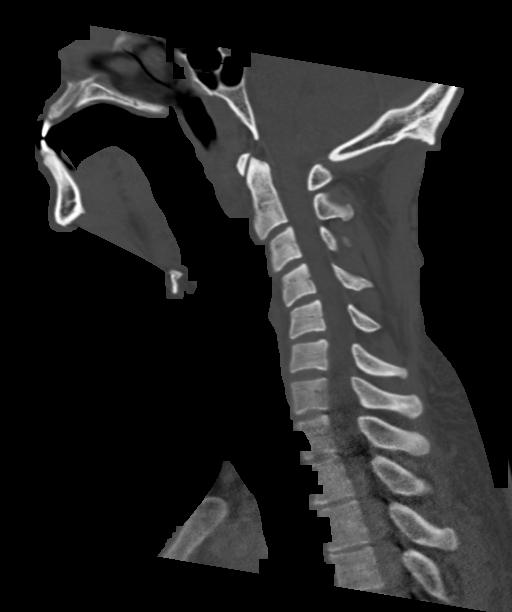
[im 59/101  bone]
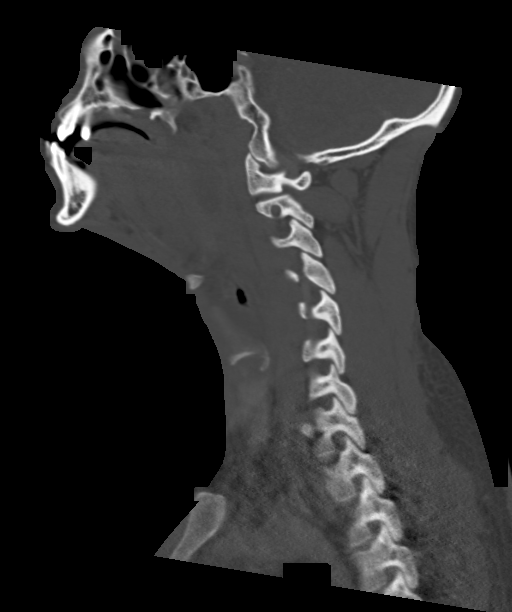
[im 67/101  bone]
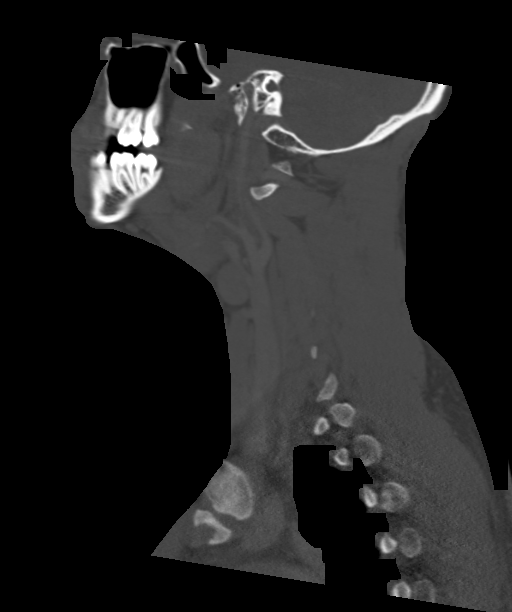

[Series 5: cor neck · coronal · 0.46mm/px · 3 of 134 slices shown]
[im 27/134  bone]
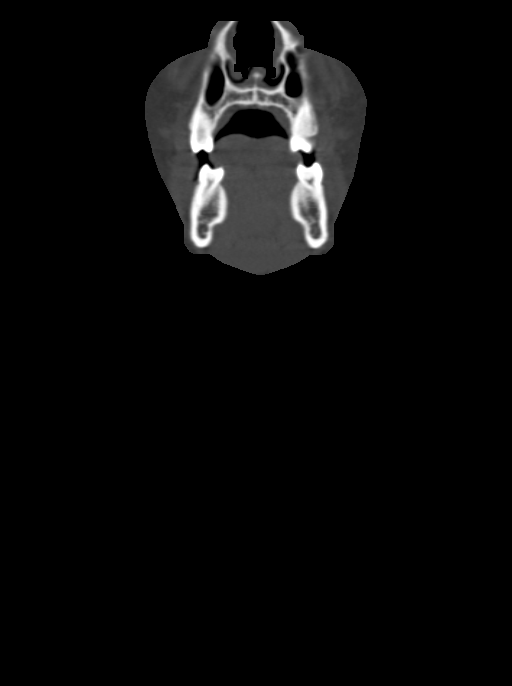
[im 54/134  bone]
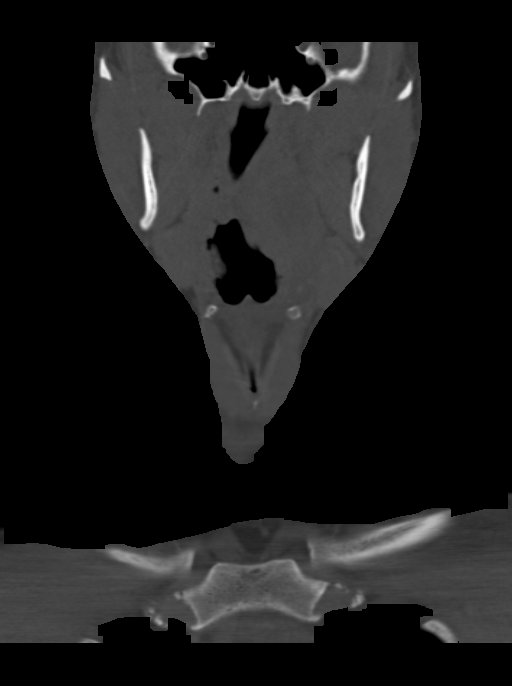
[im 80/134  bone]
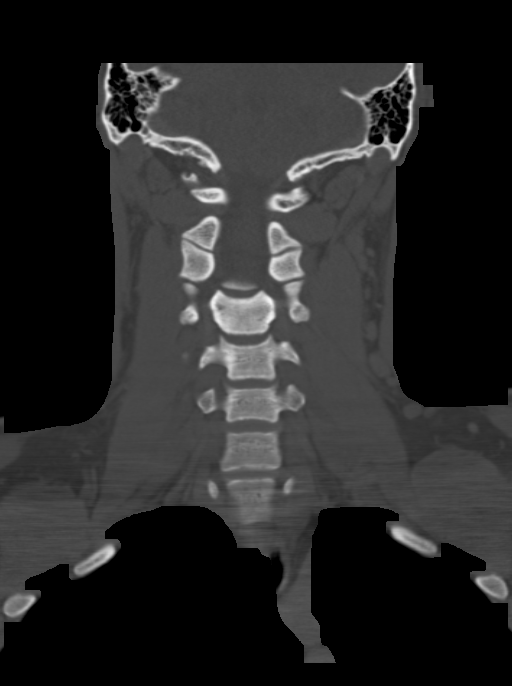

[Series 6: ax oropharynx · axial · 0.45mm/px · z∈[-349,-136]mm · 4 of 162 slices shown, 5 images]
[im 27/162  soft-tissue]
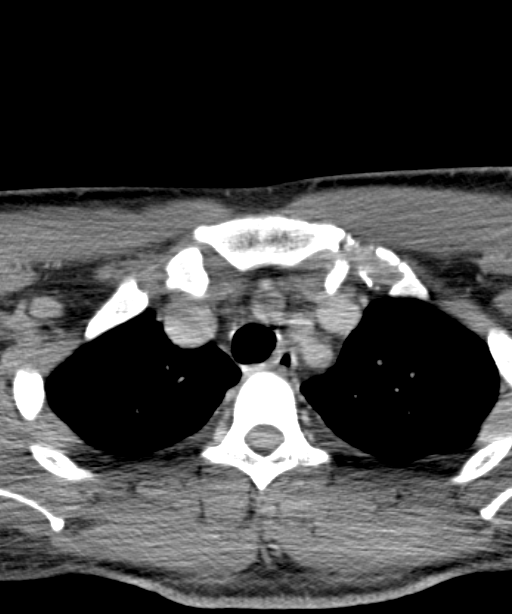
[im 27/162  bone]
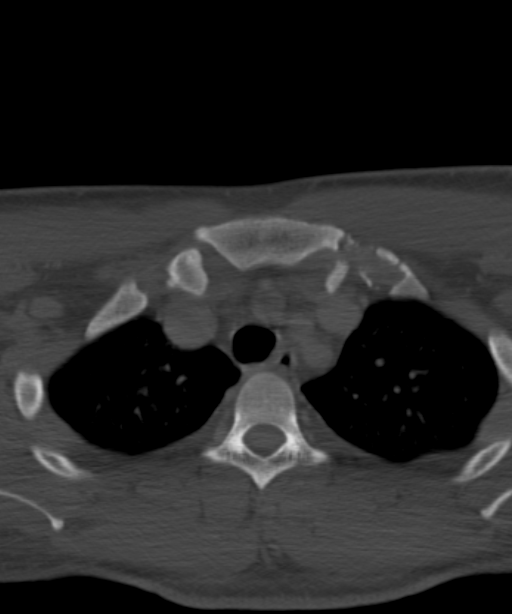
[im 54/162  bone]
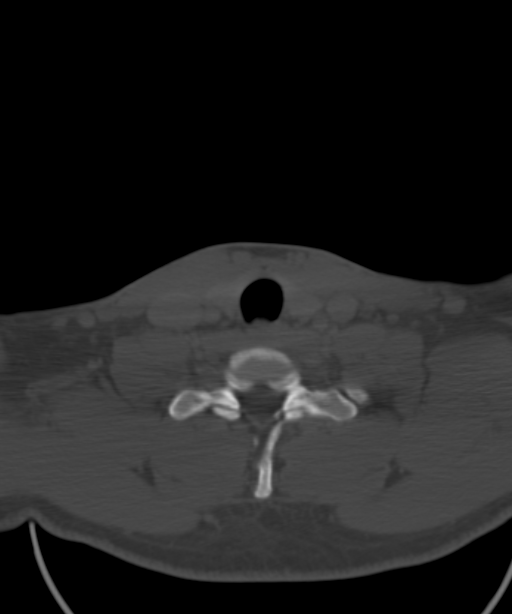
[im 108/162  bone]
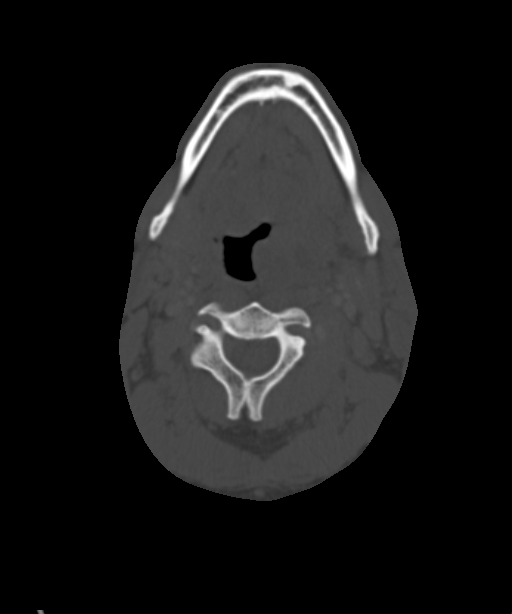
[im 135/162  bone]
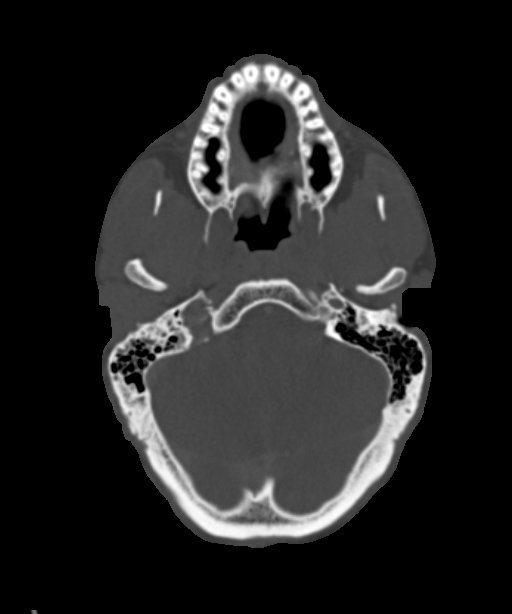

[12 of 33 positions shown; findings below may reference images not displayed]

FINDINGS: Visualized portions of the brain are unremarkable.

Visualized paranasal sinuses are clear. Visualized mastoid air cells
are well pneumatized. Middle ear cavities are clear.

Salivary glands including the parotid glands and submandibular
glands are within normal limits.

Oral cavity itself is normal. No acute abnormality about the
dentition.

Asymmetric swelling and edema within the left palatine tonsil. There
is a in hypodense rim enhancing collection just lateral and superior
to the left tonsil that measures 1.7 x 2.2 x 3.0 cm, consistent with
tonsillar/ peritonsillar abscess. The swelling within the adjacent
left oropharyngeal mucosa as well as the uvula. Inflammatory
stranding within the left parapharyngeal fat. Mild inflammatory
stranding extends inferiorly and anteriorly into the left
submandibular space as well. Associated retropharyngeal effusion
without frank abscess. Layering secretions within the hypopharynx at
the level of the piriform sinuses. Swelling extends within the
pharyngeal mucosa down to the left piriform sinus. Oropharyngeal
airway is somewhat narrowed but remains patent. Epiglottis itself is
normal. Vallecula is clear. Aryepiglottic folds without significant
edema. True cords normal. Subglottic airway clear.

Enlarged bilateral level 2 lymph nodes measure up to 16 mm on the
left and 13 mm on the right, likely reactive. No other significant
adenopathy within the neck.

Thyroid gland is normal.

Visualized superior mediastinum within normal limits.

Visualized lungs are clear.

Normal intravascular enhancement seen throughout the neck.

No acute osseous abnormality. No worrisome lytic or blastic osseous
lesions.
IMPRESSION: 1. 1.7 x 2.2 x 3.0 cm left tonsillar/peritonsillar abscess. There is
associated swelling and edema within the left oropharynx/ pharynx
with inflammatory stranding within the left parapharyngeal and
submandibular spaces. Retropharyngeal effusion without frank
abscess. Oropharyngeal airway narrowed but remains patent at this
time.
2. Bilateral level II adenopathy, likely reactive.

## 2017-04-22 ENCOUNTER — Emergency Department: Payer: Self-pay

## 2017-04-22 ENCOUNTER — Emergency Department
Admission: EM | Admit: 2017-04-22 | Discharge: 2017-04-22 | Disposition: A | Discharge status: Home / Self Care | Payer: Self-pay | Attending: Emergency Medicine | Admitting: Emergency Medicine

## 2017-04-22 DIAGNOSIS — J029 Acute pharyngitis, unspecified: Secondary | ICD-10-CM

## 2017-04-22 DIAGNOSIS — Z791 Long term (current) use of non-steroidal anti-inflammatories (NSAID): Secondary | ICD-10-CM | POA: Insufficient documentation

## 2017-04-22 DIAGNOSIS — J039 Acute tonsillitis, unspecified: Secondary | ICD-10-CM | POA: Insufficient documentation

## 2017-04-22 LAB — COMPREHENSIVE METABOLIC PANEL
ALBUMIN: 4.6 g/dL (ref 3.5–5.0)
ALT: 15 U/L — ABNORMAL LOW (ref 17–63)
AST: 20 U/L (ref 15–41)
Alkaline Phosphatase: 41 U/L (ref 38–126)
Anion gap: 10 (ref 5–15)
BUN: 15 mg/dL (ref 6–20)
CHLORIDE: 101 mmol/L (ref 101–111)
CO2: 27 mmol/L (ref 22–32)
Calcium: 9.7 mg/dL (ref 8.9–10.3)
Creatinine, Ser: 1.36 mg/dL — ABNORMAL HIGH (ref 0.61–1.24)
GFR calc Af Amer: >60 mL/min (ref >60)
GFR calc non Af Amer: >60 mL/min (ref >60)
GLUCOSE: 95 mg/dL (ref 65–99)
POTASSIUM: 3.8 mmol/L (ref 3.5–5.1)
Sodium: 138 mmol/L (ref 135–145)
Total Bilirubin: 1 mg/dL (ref 0.3–1.2)
Total Protein: 8.1 g/dL (ref 6.5–8.1)

## 2017-04-22 LAB — CBC WITH DIFFERENTIAL/PLATELET
Basophils Absolute: 0 10*3/uL (ref 0–0.1)
Basophils Relative: 0 %
EOS PCT: 0 %
Eosinophils Absolute: 0 10*3/uL (ref 0–0.7)
HEMATOCRIT: 43.8 % (ref 40.0–52.0)
Hemoglobin: 14.5 g/dL (ref 13.0–18.0)
LYMPHS PCT: 8 %
Lymphs Abs: 0.8 10*3/uL — ABNORMAL LOW (ref 1.0–3.6)
MCH: 27.4 pg (ref 26.0–34.0)
MCHC: 33.1 g/dL (ref 32.0–36.0)
MCV: 82.8 fL (ref 80.0–100.0)
MONO ABS: 0.7 10*3/uL (ref 0.2–1.0)
MONOS PCT: 7 %
NEUTROS ABS: 8.9 10*3/uL — AB (ref 1.4–6.5)
Neutrophils Relative %: 85 %
PLATELETS: 117 10*3/uL — AB (ref 150–440)
RBC: 5.29 MIL/uL (ref 4.40–5.90)
RDW: 13.6 % (ref 11.5–14.5)
WBC: 10.6 10*3/uL (ref 3.8–10.6)

## 2017-04-22 LAB — POCT RAPID STREP A: Streptococcus, Group A Screen (Direct): NEGATIVE

## 2017-04-22 MED ORDER — ONDANSETRON 8 MG PO TBDP: 8.0000 mg | ORAL_TABLET | Freq: Once | ORAL | Status: DC

## 2017-04-22 MED ORDER — CLINDAMYCIN PHOSPHATE 600 MG/50ML IV SOLN
600.0000 mg | Freq: Once | INTRAVENOUS | Status: AC
  Administered 2017-04-22: 600 mg via INTRAVENOUS
  Filled 2017-04-22: qty 50

## 2017-04-22 MED ORDER — ONDANSETRON HCL 4 MG/2ML IJ SOLN
INTRAMUSCULAR | Status: AC
  Filled 2017-04-22: qty 2

## 2017-04-22 MED ORDER — IOPAMIDOL (ISOVUE-300) INJECTION 61%
75.0000 mL | Freq: Once | INTRAVENOUS | Status: AC | PRN
  Administered 2017-04-22: 75 mL via INTRAVENOUS
  Filled 2017-04-22: qty 75

## 2017-04-22 MED ORDER — ONDANSETRON HCL 4 MG/2ML IJ SOLN
4.0000 mg | Freq: Once | INTRAMUSCULAR | Status: AC
  Administered 2017-04-22: 4 mg via INTRAVENOUS

## 2017-04-22 MED ORDER — SODIUM CHLORIDE 0.9 % IV SOLN
8.0000 mg | Freq: Once | INTRAVENOUS | Status: DC
  Filled 2017-04-22: qty 4

## 2017-04-22 MED ORDER — ACETAMINOPHEN 325 MG PO TABS: 650.0000 mg | ORAL_TABLET | Freq: Once | ORAL | Status: DC

## 2017-04-22 MED ORDER — PREDNISONE 10 MG (21) PO TBPK: ORAL_TABLET | ORAL | 0 refills | Status: AC

## 2017-04-22 MED ORDER — SODIUM CHLORIDE 0.9 % IV SOLN: 3.0000 g | Freq: Once | INTRAVENOUS | Status: DC

## 2017-04-22 MED ORDER — CLINDAMYCIN HCL 300 MG PO CAPS: 300.0000 mg | ORAL_CAPSULE | Freq: Three times a day (TID) | ORAL | 0 refills | Status: AC

## 2017-04-22 MED ORDER — DIPHENHYDRAMINE HCL 50 MG/ML IJ SOLN: 50.0000 mg | Freq: Once | INTRAMUSCULAR | Status: DC

## 2017-04-22 MED ORDER — METHYLPREDNISOLONE SODIUM SUCC 125 MG IJ SOLR
125.0000 mg | Freq: Once | INTRAMUSCULAR | Status: AC
  Administered 2017-04-22: 125 mg via INTRAVENOUS
  Filled 2017-04-22: qty 2

## 2017-04-22 MED ORDER — SODIUM CHLORIDE 0.9 % IV BOLUS (SEPSIS)
1000.0000 mL | Freq: Once | INTRAVENOUS | Status: AC
  Administered 2017-04-22: 1000 mL via INTRAVENOUS

## 2017-04-22 MED ORDER — KETOROLAC TROMETHAMINE 30 MG/ML IJ SOLN
15.0000 mg | Freq: Once | INTRAMUSCULAR | Status: AC
  Administered 2017-04-22: 15 mg via INTRAVENOUS
  Filled 2017-04-22: qty 1

## 2017-04-22 MED ORDER — IBUPROFEN 100 MG/5ML PO SUSP
400.0000 mg | Freq: Once | ORAL | Status: AC
  Administered 2017-04-22: 400 mg via ORAL
  Filled 2017-04-22: qty 20

## 2017-04-22 NOTE — ED Triage Notes (Signed)
Pt reports sore throat and left swollen tonsil since last night.

## 2017-04-22 NOTE — ED Notes (Signed)
See triage note  States he developed sore throat last pm with fever  Having increased swelling and pain swallowing  Low grade fever on arrival

## 2017-04-22 NOTE — ED Notes (Signed)
Pharmacy called for clindamycin

## 2017-04-22 NOTE — ED Provider Notes (Signed)
[facility] Emergency Department Provider Note   ____________________________________________   I have reviewed the triage vital signs and the nursing notes.   HISTORY  Chief Complaint Sore Throat    HPI [name] is a 26 y.o. male presents with sore throat, swollen left tonsil, low grade fever and difficulty swallowing. Patient reports onset of symptoms ~24 hours ago. Patient reports recurrent tonsillitis over the last 3 years and has been seen in the emergency department for these episodes. Patient could not recall past treatment history however, chart history revealed last April patient was treated for peritonsillar abscess. Patient denies headache, vision changes, chest pain, chest tightness, shortness of breath, abdominal pain, nausea and vomiting.  Past Medical History:  Diagnosis Date  . Patient denies medical problems     Patient Active Problem List   Diagnosis Date Noted  . Tonsillar abscess 02/01/2016    Past Surgical History:  Procedure Laterality Date  . none      Prior to Admission medications   Medication Sig Start Date End Date Taking? Authorizing Provider  brompheniramine-pseudoephedrine-DM 30-2-10 MG/5ML syrup Take 5 mLs by mouth 4 (four) times daily as needed. 10/29/15   Joni Reining, PA-C  clindamycin (CLEOCIN) 300 MG capsule Take 1 capsule (300 mg total) by mouth 3 (three) times daily. 04/22/17 05/02/17  Melea Prezioso M, PA-C  ibuprofen (ADVIL,MOTRIN) 800 MG tablet Take 1 tablet (800 mg total) by mouth every 8 (eight) hours as needed. 10/29/15   Joni Reining, PA-C  lidocaine (XYLOCAINE) 2 % solution Use as directed 5 mLs in the mouth or throat every 6 (six) hours as needed for mouth pain. Mixed with 5 ML's of Phenergan DM with swish and swallow. 01/29/16   Joni Reining, PA-C  lidocaine (XYLOCAINE) 2 % solution Use as directed 5 mLs in the mouth or throat every 6 (six) hours as needed for mouth pain. Use as oral swish and  swallow prior to taking medication or eating. 04/01/16   Joni Reining, PA-C  meloxicam (MOBIC) 15 MG tablet Take 1 tablet (15 mg total) by mouth daily. 11/16/16   Cuthriell, Delorise Royals, PA-C  methocarbamol (ROBAXIN) 500 MG tablet Take 1 tablet (500 mg total) by mouth 4 (four) times daily. 11/16/16   Cuthriell, Delorise Royals, PA-C  methylPREDNISolone (MEDROL DOSEPAK) 4 MG TBPK tablet Take Tapered dose as directed 04/01/16   Joni Reining, PA-C  predniSONE (STERAPRED UNI-PAK 21 TAB) 10 MG (21) TBPK tablet Take 6 tablets on day 1. Take 5 tablets on day 2. Take 4 tablets on day 3. Take 3 tablets on day 4. Take 2 tablets on day 5. Take 1 tablets on day 6. 04/22/17   Eyanna Mcgonagle M, PA-C  promethazine-dextromethorphan (PROMETHAZINE-DM) 6.25-15 MG/5ML syrup Take 5 mLs by mouth 4 (four) times daily as needed for cough. Mixed with 5 ML's of viscous lidocaine for swish and swallow. 01/29/16   Joni Reining, PA-C    Allergies Penicillins  Family History  Problem Relation Age of Onset  . Hypertension Mother     Social History Social History  Substance Use Topics  . Smoking status: Never Smoker  . Smokeless tobacco: Never Used  . Alcohol use No    Review of Systems Constitutional: Negative for fever/chills Eyes: No visual changes. ENT:  Positive for sore throat and for difficulty swallowing Cardiovascular: Denies chest pain. Respiratory: Denies cough Denies shortness of breath. Gastrointestinal: No abdominal pain.  No nausea, vomiting, diarrhea. Musculoskeletal: Positive  for generalized body aches. Skin: Negative for rash. Neurological: Negative for headaches.  ____________________________________________   PHYSICAL EXAM:  VITAL SIGNS: ED Triage Vitals  Enc Vitals Group     BP 04/22/17 1632 (!) 150/86     Pulse Rate 04/22/17 1632 79     Resp 04/22/17 1632 14     Temp 04/22/17 1632 99.3 F (37.4 C)     Temp Source 04/22/17 1632 Oral     SpO2 04/22/17 1632 99 %     Weight 04/22/17 1631  251 lb (113.9 kg)     Height 04/22/17 1631 6\' 4"  (1.93 m)     Head Circumference --      Peak Flow --      Pain Score 04/22/17 1630 8     Pain Loc --      Pain Edu? --      Excl. in GC? --     Constitutional: Alert and oriented. Ill appearing and mild distress.  Head: Normocephalic and atraumatic. Eyes: Conjunctivae are normal. PERRL.  Nose: No congestion/rhinorrea. Mouth/Throat: Mucous membranes are moist. Oropharynx erythematous with white patchy exudate along the left tonsil. Tonsils asymmetrical with left tonsil erythematous and swollen. Uvula nonmidline, drawn to the left. Sore throat, with difficulty swallowing. Hoarseness when vocalizing.  Neck: Supple. Hematological/Lymphatic/Immunological: Left side cervical lymphadenopathy. Cardiovascular: Normal rate, regular rhythm. Normal distal pulses. Respiratory: Normal respiratory effort.  Gastrointestinal: Soft and nontender. Musculoskeletal: Nontender with normal range of motion in all extremities. Neurologic: Normal speech and language.  Skin:  Skin is warm, dry and intact. No rash noted. Psychiatric: Mood and affect are normal.  ____________________________________________   LABS (all labs ordered are listed, but only abnormal results are displayed)  Labs Reviewed  COMPREHENSIVE METABOLIC PANEL - Abnormal; Notable for the following:       Result Value   Creatinine, Ser 1.36 (*)    ALT 15 (*)    All other components within normal limits  CBC WITH DIFFERENTIAL/PLATELET - Abnormal; Notable for the following:    Platelets 117 (*)    Neutro Abs 8.9 (*)    Lymphs Abs 0.8 (*)    All other components within normal limits  POCT RAPID STREP A   ____________________________________________  EKG none ____________________________________________  RADIOLOGY CT neck soft tissue IMPRESSION: Acute LEFT tonsillitis and pharyngitis without drainable fluid collection. Patent  airway. ____________________________________________   PROCEDURES  Procedure(s) performed: no    Critical Care performed: no ____________________________________________   INITIAL IMPRESSION / ASSESSMENT AND PLAN / ED COURSE  Pertinent labs & imaging results that were available during my care of the patient were reviewed by me and considered in my medical decision making (see chart for details).  Patient presents to the emergency department sore throat, swollen left tonsil, low grade fever and difficulty swallowing. History, physical exam, labs and imaging are reassuring symptoms are consistent with tonsillitis and pharyngitis. No  drainable fluid collection noted. Patient received initial antibiotic dose during course of care in the emergency department, Clindamycin 600 mg IV. Patient will be prescribed clindamycin and advised to Tylenol or ibuprofen for fever as needed.  Patient will also be encourage to rest, and rehydrate as a part of supportive care. Reassessment and vital signes are reassuring at this time. Recommended patient follow up with ENT regarding recurrent tonsillitis.  Patient informed of clinical course, understand medical decision-making process, and agree with plan. Patient was advised to follow up with ENT and was also advised to return to the emergency department for  symptoms that change or worsen.      _________________________________   FINAL CLINICAL IMPRESSION(S) / ED DIAGNOSES  Final diagnoses:  Tonsillitis  Sore throat  Acute pharyngitis, unspecified etiology       NEW MEDICATIONS STARTED DURING THIS VISIT:  Discharge Medication List as of 04/22/2017  6:59 PM    START taking these medications   Details  predniSONE (STERAPRED UNI-PAK 21 TAB) 10 MG (21) TBPK tablet Take 6 tablets on day 1. Take 5 tablets on day 2. Take 4 tablets on day 3. Take 3 tablets on day 4. Take 2 tablets on day 5. Take 1 tablets on day 6., Print         Note:  This  document was prepared using Dragon voice recognition software and may include unintentional dictation errors.   Adylene Dlugosz, Karl Pockraci M, PA-C 04/22/17 2031    Phineas SemenGoodman, [place], MD 04/22/17 2046

## 2017-04-22 NOTE — Discharge Instructions (Signed)
Recommend following up with ear nose and throat provider, Dr. Jenne CampusMcQueen regarding recurrent tonsillitis. See contact information above.

## 2017-04-22 NOTE — ED Notes (Signed)
Po ibu given for fever and pain  Pt unable to swallow and vomited   provider aware

## 2017-04-25 ENCOUNTER — Emergency Department
Admission: EM | Admit: 2017-04-25 | Discharge: 2017-04-25 | Disposition: A | Discharge status: Home / Self Care | Payer: BLUE CROSS/BLUE SHIELD | Attending: Emergency Medicine | Admitting: Emergency Medicine

## 2017-04-25 ENCOUNTER — Encounter: Payer: Self-pay | Admitting: Emergency Medicine

## 2017-04-25 DIAGNOSIS — J029 Acute pharyngitis, unspecified: Secondary | ICD-10-CM | POA: Insufficient documentation

## 2017-04-25 DIAGNOSIS — J028 Acute pharyngitis due to other specified organisms: Secondary | ICD-10-CM

## 2017-04-25 MED ORDER — LIDOCAINE VISCOUS 2 % MT SOLN
15.0000 mL | Freq: Once | OROMUCOSAL | Status: AC
  Administered 2017-04-25: 15 mL via OROMUCOSAL
  Filled 2017-04-25: qty 15

## 2017-04-25 MED ORDER — LIDOCAINE VISCOUS 2 % MT SOLN: 5.0000 mL | Freq: Four times a day (QID) | OROMUCOSAL | 0 refills | Status: AC | PRN

## 2017-04-25 NOTE — ED Triage Notes (Signed)
Patient presents to the ED with sore throat and difficulty swallowing.  Patient states he was seen 2 days ago and started on prednisone and an antibiotic.  Patient states, "these medicines aren't working any more."  Patient is in no obvious distress at this time.  Patient is maintaining secretions without difficulty.  Patient states he is having difficulty swallowing medications.

## 2017-04-25 NOTE — ED Provider Notes (Signed)
[facility] Emergency Department Provider Note   ____________________________________________   None    (approximate)  I have reviewed the triage vital signs and the nursing notes.   HISTORY  Chief Complaint Sore Throat    HPI LUCCIANO VITALI is a 26 y.o. male patient presents with sore throat and difficulty swallowing. Patient was seen 3 days ago for same complaint. Patient rapid strep test was negative. Patient had a CT of the throat which revealed pharyngitis without drainable abscess. Patient was started on antibiotics and prednisone. Patient state he was doing well until last night and had difficulty taking his medication secondary to sore throat. Patient is able to handle secretions and is not contacting ENT clinic as directed. Patient is afebrile.Patient rates his pain as a 10 over 10. Patient described a pain is "sharp".   Past Medical History:  Diagnosis Date  . Patient denies medical problems     Patient Active Problem List   Diagnosis Date Noted  . Tonsillar abscess 02/01/2016    Past Surgical History:  Procedure Laterality Date  . none      Prior to Admission medications   Medication Sig Start Date End Date Taking? Authorizing Provider  brompheniramine-pseudoephedrine-DM 30-2-10 MG/5ML syrup Take 5 mLs by mouth 4 (four) times daily as needed. 10/29/15   Joni Reining, PA-C  clindamycin (CLEOCIN) 300 MG capsule Take 1 capsule (300 mg total) by mouth 3 (three) times daily. 04/22/17 05/02/17  Little, Traci M, PA-C  ibuprofen (ADVIL,MOTRIN) 800 MG tablet Take 1 tablet (800 mg total) by mouth every 8 (eight) hours as needed. 10/29/15   Joni Reining, PA-C  lidocaine (XYLOCAINE) 2 % solution Use as directed 5 mLs in the mouth or throat every 6 (six) hours as needed for mouth pain. Mixed with 5 ML's of Phenergan DM with swish and swallow. 01/29/16   Joni Reining, PA-C  lidocaine (XYLOCAINE) 2 % solution Use as directed 5 mLs in the mouth or  throat every 6 (six) hours as needed for mouth pain. Use as oral swish and swallow prior to taking medication or eating. 04/01/16   Joni Reining, PA-C  lidocaine (XYLOCAINE) 2 % solution Use as directed 5 mLs in the mouth or throat every 6 (six) hours as needed for mouth pain. 04/25/17   Joni Reining, PA-C  meloxicam (MOBIC) 15 MG tablet Take 1 tablet (15 mg total) by mouth daily. 11/16/16   Cuthriell, Delorise Royals, PA-C  methocarbamol (ROBAXIN) 500 MG tablet Take 1 tablet (500 mg total) by mouth 4 (four) times daily. 11/16/16   Cuthriell, Delorise Royals, PA-C  methylPREDNISolone (MEDROL DOSEPAK) 4 MG TBPK tablet Take Tapered dose as directed 04/01/16   Joni Reining, PA-C  predniSONE (STERAPRED UNI-PAK 21 TAB) 10 MG (21) TBPK tablet Take 6 tablets on day 1. Take 5 tablets on day 2. Take 4 tablets on day 3. Take 3 tablets on day 4. Take 2 tablets on day 5. Take 1 tablets on day 6. 04/22/17   Little, Traci M, PA-C  promethazine-dextromethorphan (PROMETHAZINE-DM) 6.25-15 MG/5ML syrup Take 5 mLs by mouth 4 (four) times daily as needed for cough. Mixed with 5 ML's of viscous lidocaine for swish and swallow. 01/29/16   Joni Reining, PA-C    Allergies Penicillins  Family History  Problem Relation Age of Onset  . Hypertension Mother     Social History Social History  Substance Use Topics  . Smoking status: Never Smoker  .  Smokeless tobacco: Never Used  . Alcohol use No    Review of Systems  Constitutional: No fever/chills Eyes: No visual changes. WUJ:WJXBENT:Sore throat Cardiovascular: Denies chest pain. Respiratory: Denies shortness of breath. Gastrointestinal:  Genitourinary: Negative for dysuria. Musculoskeletal: Negative for back pain. Skin: Negative for rash. Neurological: Negative for headaches, focal weakness or numbness. Endocrine:Hypertension Allergic/Immunilogical:Penicillin  ____________________________________________   PHYSICAL EXAM:  VITAL SIGNS: ED Triage Vitals  Enc  Vitals Group     BP 04/25/17 0724 (!) 141/77     Pulse Rate 04/25/17 0724 75     Resp 04/25/17 0724 16     Temp 04/25/17 0724 98.3 F (36.8 C)     Temp Source 04/25/17 0724 Oral     SpO2 04/25/17 0724 99 %     Weight 04/25/17 0716 250 lb (113.4 kg)     Height 04/25/17 0716 6\' 4"  (1.93 m)     Head Circumference --      Peak Flow --      Pain Score 04/25/17 0716 10     Pain Loc --      Pain Edu? --      Excl. in GC? --     Constitutional: Alert and oriented. Well appearing and in no acute distress. Eyes: Conjunctivae are normal. PERRL. EOMI. Head: Atraumatic. Nose: No congestion/rhinnorhea. Mouth/Throat: Mucous membranes are moist.  Oropharynx erythematous. Edematous bilateral tonsils without exudate. Neck: No stridor.  No cervical spine tenderness to palpation. Hematological/Lymphatic/Immunilogical: No cervical lymphadenopathy. Cardiovascular: Normal rate, regular rhythm. Grossly normal heart sounds.  Good peripheral circulation. Respiratory: Normal respiratory effort.  No retractions. Lungs CTAB. Neurologic:  Normal speech and language. No gross focal neurologic deficits are appreciated. No gait instability. Skin:  Skin is warm, dry and intact. No rash noted. Psychiatric: Mood and affect are normal. Speech and behavior are normal.  ____________________________________________   LABS (all labs ordered are listed, but only abnormal results are displayed)  Labs Reviewed - No data to display ____________________________________________  EKG   ____________________________________________  RADIOLOGY  No results found.  ____________________________________________   PROCEDURES  Procedure(s) performed: None  Procedures  Critical Care performed: No  ____________________________________________   INITIAL IMPRESSION / ASSESSMENT AND PLAN / ED COURSE  Pertinent labs & imaging results that were available during my care of the patient were reviewed by me and  considered in my medical decision making (see chart for details).  Pharyngitis. Patient advised to continue previous medication. Patient given a prescription for 6 lidocaine to help with swallowing. Patient advised follow-up with ENT clinic as directed if no improvement in next 24 hours.      ____________________________________________   FINAL CLINICAL IMPRESSION(S) / ED DIAGNOSES  Final diagnoses:  Pharyngitis due to other organism      NEW MEDICATIONS STARTED DURING THIS VISIT:  New Prescriptions   LIDOCAINE (XYLOCAINE) 2 % SOLUTION    Use as directed 5 mLs in the mouth or throat every 6 (six) hours as needed for mouth pain.     Note:  This document was prepared using Dragon voice recognition software and may include unintentional dictation errors.    Joni ReiningSmith, Magda Muise K, PA-C 04/25/17 0745    Phineas SemenGoodman, Graydon, MD 04/25/17 1200

## 2017-04-25 NOTE — ED Notes (Signed)
See triage note  Was seen last week for same placed on antibiotics and prednisone   States was was able to swallow meds until last pm  Afebrile on arrival

## 2018-03-22 ENCOUNTER — Other Ambulatory Visit: Payer: Self-pay

## 2018-03-22 ENCOUNTER — Encounter: Payer: Self-pay | Admitting: Emergency Medicine

## 2018-03-22 ENCOUNTER — Emergency Department
Admission: EM | Admit: 2018-03-22 | Discharge: 2018-03-22 | Disposition: A | Discharge status: Home / Self Care | Payer: BLUE CROSS/BLUE SHIELD | Attending: Emergency Medicine | Admitting: Emergency Medicine

## 2018-03-22 DIAGNOSIS — R197 Diarrhea, unspecified: Secondary | ICD-10-CM | POA: Insufficient documentation

## 2018-03-22 DIAGNOSIS — Z79899 Other long term (current) drug therapy: Secondary | ICD-10-CM | POA: Insufficient documentation

## 2018-03-22 DIAGNOSIS — R112 Nausea with vomiting, unspecified: Secondary | ICD-10-CM

## 2018-03-22 DIAGNOSIS — E860 Dehydration: Secondary | ICD-10-CM

## 2018-03-22 LAB — COMPREHENSIVE METABOLIC PANEL
ALT: 53 U/L (ref 17–63)
ANION GAP: 8 (ref 5–15)
AST: 37 U/L (ref 15–41)
Albumin: 4.7 g/dL (ref 3.5–5.0)
Alkaline Phosphatase: 43 U/L (ref 38–126)
BILIRUBIN TOTAL: 0.9 mg/dL (ref 0.3–1.2)
BUN: 10 mg/dL (ref 6–20)
CHLORIDE: 104 mmol/L (ref 101–111)
CO2: 25 mmol/L (ref 22–32)
Calcium: 9.2 mg/dL (ref 8.9–10.3)
Creatinine, Ser: 1.3 mg/dL — ABNORMAL HIGH (ref 0.61–1.24)
GFR calc Af Amer: >60 mL/min (ref >60)
Glucose, Bld: 96 mg/dL (ref 65–99)
POTASSIUM: 3.8 mmol/L (ref 3.5–5.1)
Sodium: 137 mmol/L (ref 135–145)
TOTAL PROTEIN: 8.4 g/dL — AB (ref 6.5–8.1)

## 2018-03-22 LAB — CBC
HEMATOCRIT: 45.9 % (ref 40.0–52.0)
HEMOGLOBIN: 15.2 g/dL (ref 13.0–18.0)
MCH: 27.9 pg (ref 26.0–34.0)
MCHC: 33.1 g/dL (ref 32.0–36.0)
MCV: 84.1 fL (ref 80.0–100.0)
Platelets: 129 10*3/uL — ABNORMAL LOW (ref 150–440)
RBC: 5.45 MIL/uL (ref 4.40–5.90)
RDW: 13.5 % (ref 11.5–14.5)
WBC: 4.6 10*3/uL (ref 3.8–10.6)

## 2018-03-22 LAB — LIPASE, BLOOD: LIPASE: 26 U/L (ref 11–51)

## 2018-03-22 MED ORDER — ONDANSETRON HCL 4 MG/2ML IJ SOLN
4.0000 mg | Freq: Once | INTRAMUSCULAR | Status: AC
  Administered 2018-03-22: 4 mg via INTRAVENOUS
  Filled 2018-03-22: qty 2

## 2018-03-22 MED ORDER — SODIUM CHLORIDE 0.9 % IV BOLUS
1000.0000 mL | Freq: Once | INTRAVENOUS | Status: AC
  Administered 2018-03-22: 1000 mL via INTRAVENOUS

## 2018-03-22 MED ORDER — ONDANSETRON 4 MG PO TBDP: 4.0000 mg | ORAL_TABLET | Freq: Three times a day (TID) | ORAL | 0 refills | Status: AC | PRN

## 2018-03-22 NOTE — ED Provider Notes (Signed)
[facility] Emergency Department Provider Note  ____________________________________________   First MD Initiated Contact with Patient 03/22/18 1552     (approximate)  I have reviewed the triage vital signs and the nursing notes.   HISTORY  Chief Complaint Nausea and Diarrhea    HPI Derl Barrowric J Barbour is a 27 y.o. male presents emergency department complaining of nausea vomiting and diarrhea.  [facility] and [facility] 4 for 4.  When he awoke this morning he was having nausea, 2 episodes of vomiting, and diarrhea.  The diarrhea is continued throughout the day.  He denies any fever or chills.  He states he has been urinating as normal.  He denies any known sick contacts.  Past Medical History:  Diagnosis Date  . Patient denies medical problems     Patient Active Problem List   Diagnosis Date Noted  . Tonsillar abscess 02/01/2016    Past Surgical History:  Procedure Laterality Date  . none      Prior to Admission medications   Medication Sig Start Date End Date Taking? Authorizing Provider  brompheniramine-pseudoephedrine-DM 30-2-10 MG/5ML syrup Take 5 mLs by mouth 4 (four) times daily as needed. 10/29/15   Joni ReiningSmith, Ronald K, PA-C  ibuprofen (ADVIL,MOTRIN) 800 MG tablet Take 1 tablet (800 mg total) by mouth every 8 (eight) hours as needed. 10/29/15   Joni ReiningSmith, Ronald K, PA-C  lidocaine (XYLOCAINE) 2 % solution Use as directed 5 mLs in the mouth or throat every 6 (six) hours as needed for mouth pain. Mixed with 5 ML's of Phenergan DM with swish and swallow. 01/29/16   Joni ReiningSmith, Ronald K, PA-C  lidocaine (XYLOCAINE) 2 % solution Use as directed 5 mLs in the mouth or throat every 6 (six) hours as needed for mouth pain. Use as oral swish and swallow prior to taking medication or eating. 04/01/16   Joni ReiningSmith, Ronald K, PA-C  lidocaine (XYLOCAINE) 2 % solution Use as directed 5 mLs in the mouth or throat every 6 (six) hours as needed for mouth pain. 04/25/17   Joni ReiningSmith,  Ronald K, PA-C  meloxicam (MOBIC) 15 MG tablet Take 1 tablet (15 mg total) by mouth daily. 11/16/16   Cuthriell, Delorise RoyalsJonathan D, PA-C  methocarbamol (ROBAXIN) 500 MG tablet Take 1 tablet (500 mg total) by mouth 4 (four) times daily. 11/16/16   Cuthriell, Delorise RoyalsJonathan D, PA-C  methylPREDNISolone (MEDROL DOSEPAK) 4 MG TBPK tablet Take Tapered dose as directed 04/01/16   Joni ReiningSmith, Ronald K, PA-C  ondansetron (ZOFRAN-ODT) 4 MG disintegrating tablet Take 1 tablet (4 mg total) by mouth every 8 (eight) hours as needed for nausea or vomiting. 03/22/18   Sherrie MustacheFisher, Roselyn BeringSusan W, PA-C  predniSONE (STERAPRED UNI-PAK 21 TAB) 10 MG (21) TBPK tablet Take 6 tablets on day 1. Take 5 tablets on day 2. Take 4 tablets on day 3. Take 3 tablets on day 4. Take 2 tablets on day 5. Take 1 tablets on day 6. 04/22/17   Little, Traci M, PA-C  promethazine-dextromethorphan (PROMETHAZINE-DM) 6.25-15 MG/5ML syrup Take 5 mLs by mouth 4 (four) times daily as needed for cough. Mixed with 5 ML's of viscous lidocaine for swish and swallow. 01/29/16   Joni ReiningSmith, Ronald K, PA-C    Allergies Penicillins  Family History  Problem Relation Age of Onset  . Hypertension Mother     Social History Social History   Tobacco Use  . Smoking status: Never Smoker  . Smokeless tobacco: Never Used  Substance Use Topics  .  Alcohol use: No  . Drug use: No    Review of Systems  Constitutional: No fever/chills Eyes: No visual changes. ENT: No sore throat. Respiratory: Denies cough Gastrointestinal: Positive for nausea, vomiting, and diarrhea Genitourinary: Negative for dysuria. Musculoskeletal: Negative for back pain. Skin: Negative for rash.    ____________________________________________   PHYSICAL EXAM:  VITAL SIGNS: ED Triage Vitals  Enc Vitals Group     BP 03/22/18 1523 140/88     Pulse Rate 03/22/18 1523 97     Resp 03/22/18 1523 16     Temp 03/22/18 1523 98.9 F (37.2 C)     Temp Source 03/22/18 1523 Oral     SpO2 03/22/18 1523 99 %      Weight 03/22/18 1519 265 lb (120.2 kg)     Height 03/22/18 1519 6\' 4"  (1.93 m)     Head Circumference --      Peak Flow --      Pain Score 03/22/18 1519 0     Pain Loc --      Pain Edu? --      Excl. in GC? --     Constitutional: Alert and oriented. Well appearing and in no acute distress. Eyes: Conjunctivae are normal.  Head: Atraumatic. Nose: No congestion/rhinnorhea. Mouth/Throat: Mucous membranes are moist.   Cardiovascular: Normal rate, regular rhythm.  Heart sounds are normal Respiratory: Normal respiratory effort.  No retractions, lungs clear to auscultation Abdomen: Is soft, nontender bowel sounds are normal GU: deferred Musculoskeletal: FROM all extremities, warm and well perfused Neurologic:  Normal speech and language.  Skin:  Skin is warm, dry and intact. No rash noted. Psychiatric: Mood and affect are normal. Speech and behavior are normal.  ____________________________________________   LABS (all labs ordered are listed, but only abnormal results are displayed)  Labs Reviewed  COMPREHENSIVE METABOLIC PANEL - Abnormal; Notable for the following components:      Result Value   Creatinine, Ser 1.30 (*)    Total Protein 8.4 (*)    All other components within normal limits  CBC - Abnormal; Notable for the following components:   Platelets 129 (*)    All other components within normal limits  LIPASE, BLOOD   ____________________________________________   ____________________________________________  RADIOLOGY    ____________________________________________   PROCEDURES  Procedure(s) performed: Saline lock, 1 L saline IV, Zofran 4 mg IV  Procedures    ____________________________________________   INITIAL IMPRESSION / ASSESSMENT AND PLAN / ED COURSE  Pertinent labs & imaging results that were available during my care of the patient were reviewed by me and considered in my medical decision making (see chart for details).  Patient is  27 year old male presents emergency department complaining of nausea vomiting and diarrhea.  He states that he ate at [employer] yesterday and woke up this morning with some nausea vomiting and diarrhea.  He has been able to urinate as normal.  He denies fever or chills.  On physical exam patient appears well.  His vitals are normal.  Abdomen is soft, nontender bowel sounds are normal.  The remainder the exam is unremarkable.  Labs for CBC are normal, lipase is normal, complete metabolic panel shows an elevated creatinine which may be due to some dehydration.  Therefore the patient was given 1 L of normal saline while in the ED.  Explained the lab findings to the patient and his family member.  He is to follow-up with his regular doctor if not improving in 3 to 5 days.  He  will be given a work note for today and tomorrow due to his present illness.  He is to drink plenty of fluids.  He is to eat a brat diet for the next 24 hours.  He states he understands will comply with our instructions.  He was discharged in stable condition     As part of my medical decision making, I reviewed the following data within the electronic MEDICAL RECORD NUMBER Nursing notes reviewed and incorporated, Labs reviewed CBC, lipase are normal.  Complete metabolic panel shows an elevated creatinine at 1.3, Old chart reviewed, Notes from prior ED visits and [place] Controlled Substance Database  ____________________________________________   FINAL CLINICAL IMPRESSION(S) / ED DIAGNOSES  Final diagnoses:  Dehydration  Nausea vomiting and diarrhea      NEW MEDICATIONS STARTED DURING THIS VISIT:  New Prescriptions   ONDANSETRON (ZOFRAN-ODT) 4 MG DISINTEGRATING TABLET    Take 1 tablet (4 mg total) by mouth every 8 (eight) hours as needed for nausea or vomiting.     Note:  This document was prepared using Dragon voice recognition software and may include unintentional dictation errors.    Faythe Ghee, PA-C 03/22/18  1711    Sharyn Creamer, MD 03/23/18 [phone]

## 2018-03-22 NOTE — ED Notes (Signed)
Patient called to be triaged x1, no response noted at this time from patient.  Staff checked restrooms, lobby, and outdoor facility.   

## 2018-03-22 NOTE — ED Triage Notes (Signed)
Pt comes into the ED via POV c/o N/V/D.  Patient states he ate hot wings last night and then woke up with it this morning.  Patient in NAD at this time and denies any abdominal pina.  Patient ambulatory to triage.

## 2018-03-22 NOTE — Discharge Instructions (Addendum)
Follow-up with your regular doctor return to the emergency department if you are not better in 2 to 3 days.  Use the Zofran for any nausea you may have.  This medication melts under your tongue.  Drink plenty of fluids to stay hydrated.  For every episode of diarrhea you should drink 1 glass of water or Gatorade.  Eat a very bland diet for the next few days.  Start slowly with a brat diet which includes bananas, rice, applesauce and toast.  If you are worsening please return to the emergency department

## 2018-03-22 NOTE — ED Notes (Signed)
Pt ambulatory to POV with s/o without difficulty. VSS. NAD. Discharge instructions, and follow up reviewed. All questions and concerns addressed.

## 2018-03-22 NOTE — ED Notes (Signed)
Patient called to be triaged x2, no response noted at this time from patient.  Staff checked restrooms, lobby, and outdoor facility.
